# Patient Record
Sex: Female | Born: 1950 | Race: Black or African American | Hispanic: No | Smoking: Former smoker
Health system: Southern US, Community
[De-identification: ages and names within clinical notes are randomized; demographics above are authoritative.]

## PROBLEM LIST (undated history)

## (undated) DIAGNOSIS — K219 Gastro-esophageal reflux disease without esophagitis: Secondary | ICD-10-CM

## (undated) DIAGNOSIS — G709 Myoneural disorder, unspecified: Secondary | ICD-10-CM

## (undated) DIAGNOSIS — D649 Anemia, unspecified: Secondary | ICD-10-CM

## (undated) DIAGNOSIS — I10 Essential (primary) hypertension: Secondary | ICD-10-CM

## (undated) DIAGNOSIS — G4730 Sleep apnea, unspecified: Secondary | ICD-10-CM

## (undated) DIAGNOSIS — Z8719 Personal history of other diseases of the digestive system: Secondary | ICD-10-CM

## (undated) DIAGNOSIS — M1990 Unspecified osteoarthritis, unspecified site: Secondary | ICD-10-CM

## (undated) DIAGNOSIS — T7840XA Allergy, unspecified, initial encounter: Secondary | ICD-10-CM

## (undated) DIAGNOSIS — H269 Unspecified cataract: Secondary | ICD-10-CM

## (undated) DIAGNOSIS — R51 Headache: Secondary | ICD-10-CM

## (undated) HISTORY — DX: Allergy, unspecified, initial encounter: T78.40XA

## (undated) HISTORY — PX: TOTAL HIP ARTHROPLASTY: SHX124

## (undated) HISTORY — DX: Essential (primary) hypertension: I10

## (undated) HISTORY — PX: BACK SURGERY: SHX140

## (undated) HISTORY — DX: Unspecified cataract: H26.9

## (undated) HISTORY — PX: ABDOMINAL HYSTERECTOMY: SHX81

## (undated) HISTORY — DX: Myoneural disorder, unspecified: G70.9

## (undated) HISTORY — DX: Anemia, unspecified: D64.9

## (undated) HISTORY — PX: POLYPECTOMY: SHX149

## (undated) HISTORY — PX: OTHER SURGICAL HISTORY: SHX169

## (undated) HISTORY — PX: HERNIA REPAIR: SHX51

---

## 1997-10-16 ENCOUNTER — Ambulatory Visit (HOSPITAL_COMMUNITY): Admission: RE | Admit: 1997-10-16 | Discharge: 1997-10-16 | Payer: Self-pay | Admitting: *Deleted

## 1997-12-03 ENCOUNTER — Ambulatory Visit (HOSPITAL_COMMUNITY): Admission: RE | Admit: 1997-12-03 | Discharge: 1997-12-03 | Payer: Self-pay | Admitting: *Deleted

## 1998-01-30 ENCOUNTER — Emergency Department (HOSPITAL_COMMUNITY): Admission: EM | Admit: 1998-01-30 | Discharge: 1998-01-30 | Payer: Self-pay | Admitting: Emergency Medicine

## 1998-12-31 ENCOUNTER — Emergency Department (HOSPITAL_COMMUNITY): Admission: EM | Admit: 1998-12-31 | Discharge: 1998-12-31 | Payer: Self-pay

## 1999-03-14 ENCOUNTER — Encounter: Payer: Self-pay | Admitting: Emergency Medicine

## 1999-03-14 ENCOUNTER — Emergency Department (HOSPITAL_COMMUNITY): Admission: EM | Admit: 1999-03-14 | Discharge: 1999-03-14 | Payer: Self-pay | Admitting: Emergency Medicine

## 1999-03-30 ENCOUNTER — Ambulatory Visit (HOSPITAL_COMMUNITY): Admission: RE | Admit: 1999-03-30 | Discharge: 1999-03-30 | Payer: Self-pay | Admitting: *Deleted

## 1999-03-30 ENCOUNTER — Encounter: Payer: Self-pay | Admitting: *Deleted

## 1999-04-20 ENCOUNTER — Ambulatory Visit (HOSPITAL_COMMUNITY): Admission: RE | Admit: 1999-04-20 | Discharge: 1999-04-20 | Payer: Self-pay | Admitting: *Deleted

## 1999-04-20 ENCOUNTER — Encounter: Payer: Self-pay | Admitting: *Deleted

## 1999-06-14 ENCOUNTER — Encounter: Payer: Self-pay | Admitting: Neurosurgery

## 1999-06-15 ENCOUNTER — Inpatient Hospital Stay (HOSPITAL_COMMUNITY): Admission: RE | Admit: 1999-06-15 | Discharge: 1999-06-17 | Payer: Self-pay | Admitting: Neurosurgery

## 1999-06-15 ENCOUNTER — Encounter: Payer: Self-pay | Admitting: Neurosurgery

## 1999-06-15 ENCOUNTER — Encounter: Payer: Self-pay | Admitting: *Deleted

## 2000-05-08 ENCOUNTER — Encounter: Payer: Self-pay | Admitting: Family Medicine

## 2000-05-08 ENCOUNTER — Encounter: Admission: RE | Admit: 2000-05-08 | Discharge: 2000-05-08 | Payer: Self-pay | Admitting: Family Medicine

## 2000-06-04 ENCOUNTER — Ambulatory Visit (HOSPITAL_COMMUNITY): Admission: RE | Admit: 2000-06-04 | Discharge: 2000-06-04 | Payer: Self-pay | Admitting: Neurosurgery

## 2000-06-04 ENCOUNTER — Encounter: Payer: Self-pay | Admitting: Neurosurgery

## 2000-06-23 ENCOUNTER — Encounter: Admission: RE | Admit: 2000-06-23 | Discharge: 2000-08-04 | Payer: Self-pay | Admitting: Neurosurgery

## 2000-06-28 ENCOUNTER — Ambulatory Visit (HOSPITAL_COMMUNITY): Admission: RE | Admit: 2000-06-28 | Discharge: 2000-06-28 | Payer: Self-pay | Admitting: Neurosurgery

## 2000-06-28 ENCOUNTER — Encounter: Payer: Self-pay | Admitting: Neurosurgery

## 2000-07-12 ENCOUNTER — Encounter: Payer: Self-pay | Admitting: Neurosurgery

## 2000-07-12 ENCOUNTER — Ambulatory Visit (HOSPITAL_COMMUNITY): Admission: RE | Admit: 2000-07-12 | Discharge: 2000-07-12 | Payer: Self-pay | Admitting: Neurosurgery

## 2000-07-21 ENCOUNTER — Ambulatory Visit (HOSPITAL_COMMUNITY): Admission: RE | Admit: 2000-07-21 | Discharge: 2000-07-21 | Payer: Self-pay | Admitting: Neurosurgery

## 2000-07-21 ENCOUNTER — Encounter: Payer: Self-pay | Admitting: Neurosurgery

## 2000-08-01 ENCOUNTER — Encounter: Payer: Self-pay | Admitting: Neurosurgery

## 2000-08-01 ENCOUNTER — Inpatient Hospital Stay (HOSPITAL_COMMUNITY): Admission: RE | Admit: 2000-08-01 | Discharge: 2000-08-03 | Payer: Self-pay | Admitting: Neurosurgery

## 2000-09-04 ENCOUNTER — Encounter: Admission: RE | Admit: 2000-09-04 | Discharge: 2000-12-03 | Payer: Self-pay | Admitting: Neurosurgery

## 2000-10-17 ENCOUNTER — Encounter: Payer: Self-pay | Admitting: Neurosurgery

## 2000-10-17 ENCOUNTER — Ambulatory Visit (HOSPITAL_COMMUNITY): Admission: RE | Admit: 2000-10-17 | Discharge: 2000-10-17 | Payer: Self-pay | Admitting: Neurosurgery

## 2001-03-01 ENCOUNTER — Ambulatory Visit (HOSPITAL_COMMUNITY): Admission: RE | Admit: 2001-03-01 | Discharge: 2001-03-01 | Payer: Self-pay | Admitting: Neurosurgery

## 2001-03-01 ENCOUNTER — Encounter: Payer: Self-pay | Admitting: Neurosurgery

## 2001-11-30 ENCOUNTER — Encounter: Payer: Self-pay | Admitting: Neurosurgery

## 2001-12-04 ENCOUNTER — Encounter: Payer: Self-pay | Admitting: Neurosurgery

## 2001-12-04 ENCOUNTER — Inpatient Hospital Stay (HOSPITAL_COMMUNITY): Admission: RE | Admit: 2001-12-04 | Discharge: 2001-12-10 | Payer: Self-pay | Admitting: Neurosurgery

## 2002-03-23 ENCOUNTER — Emergency Department (HOSPITAL_COMMUNITY): Admission: EM | Admit: 2002-03-23 | Discharge: 2002-03-23 | Payer: Self-pay | Admitting: Emergency Medicine

## 2002-12-27 ENCOUNTER — Encounter: Payer: Self-pay | Admitting: Orthopedic Surgery

## 2003-01-01 ENCOUNTER — Encounter: Payer: Self-pay | Admitting: Orthopedic Surgery

## 2003-01-01 ENCOUNTER — Inpatient Hospital Stay (HOSPITAL_COMMUNITY): Admission: RE | Admit: 2003-01-01 | Discharge: 2003-01-05 | Payer: Self-pay | Admitting: Orthopedic Surgery

## 2003-04-04 ENCOUNTER — Inpatient Hospital Stay (HOSPITAL_COMMUNITY): Admission: RE | Admit: 2003-04-04 | Discharge: 2003-04-08 | Payer: Self-pay | Admitting: Orthopedic Surgery

## 2003-05-27 ENCOUNTER — Other Ambulatory Visit: Admission: RE | Admit: 2003-05-27 | Discharge: 2003-05-27 | Payer: Self-pay | Admitting: Nephrology

## 2003-05-27 ENCOUNTER — Other Ambulatory Visit: Admission: RE | Admit: 2003-05-27 | Discharge: 2003-05-27 | Payer: Self-pay | Admitting: Cardiology

## 2003-10-01 ENCOUNTER — Inpatient Hospital Stay (HOSPITAL_COMMUNITY): Admission: RE | Admit: 2003-10-01 | Discharge: 2003-10-03 | Payer: Self-pay | Admitting: Orthopedic Surgery

## 2003-10-29 ENCOUNTER — Encounter: Admission: RE | Admit: 2003-10-29 | Discharge: 2004-01-27 | Payer: Self-pay | Admitting: Orthopedic Surgery

## 2004-04-21 ENCOUNTER — Inpatient Hospital Stay (HOSPITAL_COMMUNITY): Admission: RE | Admit: 2004-04-21 | Discharge: 2004-04-23 | Payer: Self-pay | Admitting: Orthopedic Surgery

## 2004-05-18 ENCOUNTER — Encounter: Admission: RE | Admit: 2004-05-18 | Discharge: 2004-07-08 | Payer: Self-pay | Admitting: Orthopedic Surgery

## 2004-10-18 ENCOUNTER — Other Ambulatory Visit: Admission: RE | Admit: 2004-10-18 | Discharge: 2004-10-18 | Payer: Self-pay | Admitting: Nephrology

## 2004-12-08 ENCOUNTER — Ambulatory Visit: Payer: Self-pay | Admitting: Internal Medicine

## 2005-02-17 ENCOUNTER — Encounter: Admission: RE | Admit: 2005-02-17 | Discharge: 2005-02-17 | Payer: Self-pay | Admitting: Nephrology

## 2005-06-06 HISTORY — PX: COLONOSCOPY: SHX174

## 2005-06-19 ENCOUNTER — Ambulatory Visit (HOSPITAL_BASED_OUTPATIENT_CLINIC_OR_DEPARTMENT_OTHER): Admission: RE | Admit: 2005-06-19 | Discharge: 2005-06-19 | Payer: Self-pay | Admitting: Nephrology

## 2005-06-26 ENCOUNTER — Ambulatory Visit: Payer: Self-pay | Admitting: Internal Medicine

## 2005-07-13 ENCOUNTER — Ambulatory Visit: Payer: Self-pay | Admitting: Internal Medicine

## 2005-07-19 ENCOUNTER — Encounter (INDEPENDENT_AMBULATORY_CARE_PROVIDER_SITE_OTHER): Payer: Self-pay | Admitting: *Deleted

## 2005-07-19 ENCOUNTER — Ambulatory Visit: Payer: Self-pay | Admitting: Internal Medicine

## 2005-11-21 ENCOUNTER — Other Ambulatory Visit: Admission: RE | Admit: 2005-11-21 | Discharge: 2005-11-21 | Payer: Self-pay | Admitting: Nephrology

## 2006-05-17 ENCOUNTER — Encounter: Admission: RE | Admit: 2006-05-17 | Discharge: 2006-05-17 | Payer: Self-pay | Admitting: Nephrology

## 2006-06-05 ENCOUNTER — Encounter: Admission: RE | Admit: 2006-06-05 | Discharge: 2006-08-29 | Payer: Self-pay | Admitting: Nephrology

## 2006-12-28 ENCOUNTER — Other Ambulatory Visit: Admission: RE | Admit: 2006-12-28 | Discharge: 2006-12-28 | Payer: Self-pay | Admitting: Cardiology

## 2007-02-02 ENCOUNTER — Encounter: Admission: RE | Admit: 2007-02-02 | Discharge: 2007-02-02 | Payer: Self-pay | Admitting: Anesthesiology

## 2007-02-21 ENCOUNTER — Encounter: Admission: RE | Admit: 2007-02-21 | Discharge: 2007-02-21 | Payer: Self-pay | Admitting: General Surgery

## 2007-02-22 ENCOUNTER — Ambulatory Visit (HOSPITAL_BASED_OUTPATIENT_CLINIC_OR_DEPARTMENT_OTHER): Admission: RE | Admit: 2007-02-22 | Discharge: 2007-02-22 | Payer: Self-pay | Admitting: General Surgery

## 2008-04-01 ENCOUNTER — Ambulatory Visit (HOSPITAL_COMMUNITY): Admission: RE | Admit: 2008-04-01 | Discharge: 2008-04-01 | Payer: Self-pay | Admitting: Anesthesiology

## 2008-06-02 ENCOUNTER — Ambulatory Visit (HOSPITAL_COMMUNITY): Admission: RE | Admit: 2008-06-02 | Discharge: 2008-06-03 | Payer: Self-pay | Admitting: Neurological Surgery

## 2008-08-26 ENCOUNTER — Encounter: Admission: RE | Admit: 2008-08-26 | Discharge: 2008-08-26 | Payer: Self-pay | Admitting: Cardiology

## 2008-11-25 ENCOUNTER — Encounter: Admission: RE | Admit: 2008-11-25 | Discharge: 2008-11-25 | Payer: Self-pay | Admitting: Physician Assistant

## 2010-06-26 ENCOUNTER — Encounter: Payer: Self-pay | Admitting: Nephrology

## 2010-06-27 ENCOUNTER — Encounter (HOSPITAL_BASED_OUTPATIENT_CLINIC_OR_DEPARTMENT_OTHER): Payer: Self-pay | Admitting: General Surgery

## 2010-07-21 ENCOUNTER — Encounter: Payer: Self-pay | Admitting: Internal Medicine

## 2010-07-28 NOTE — Letter (Signed)
Summary: Colonoscopy Date Change Letter  [facility]  [address] [place], [place] [address]   Phone: [phone]  Fax: [phone]      July 21, 2010 MRN: [id number]   [facility] [address] [place], [place]  [address]   Dear Ms. Braziel,   Previously you were recommended to have a repeat colonoscopy around this time. Your chart was recently reviewed by Dr. Lina Sar of [facility]. Follow up colonoscopy is now recommended in February 2014. This revised recommendation is based on current, nationally recognized guidelines for colorectal cancer screening and polyp surveillance. These guidelines are endorsed by the American Cancer Society, [employer] on Colorectal Cancer as well as numerous other major medical organizations.  Please understand that our recommendation assumes that you do not have any new symptoms such as bleeding, a change in bowel habits, anemia, or significant abdominal discomfort. If you do have any concerning GI symptoms or want to discuss the guideline recommendations, please call to arrange an office visit at your earliest convenience. Otherwise we will keep you in our reminder system and contact you 1-2 months prior to the date listed above to schedule your next colonoscopy.  Thank you,  Hedwig Morton. Juanda Chance, M.D.  [facility] [phone]

## 2010-09-28 ENCOUNTER — Ambulatory Visit
Admission: RE | Admit: 2010-09-28 | Discharge: 2010-09-28 | Disposition: A | Discharge status: Home / Self Care | Payer: Medicare Other | Source: Ambulatory Visit | Attending: Nephrology | Admitting: Nephrology

## 2010-09-28 ENCOUNTER — Other Ambulatory Visit: Payer: Self-pay | Admitting: Nephrology

## 2010-09-28 DIAGNOSIS — R05 Cough: Secondary | ICD-10-CM

## 2010-10-19 NOTE — Op Note (Signed)
Madison Hines, Madison Hines              ACCOUNT NO.:  [id number]   MEDICAL RECORD NO.:  [id number]          PATIENT TYPE:  AMB   LOCATION:  NESC                         FACILITY:  [facility]   PHYSICIAN:  Leonie Man, M.D.   DATE OF BIRTH:  January 05, 1951   DATE OF PROCEDURE:  02/22/2007  DATE OF DISCHARGE:                               OPERATIVE REPORT   PREOPERATIVE DIAGNOSIS:  1. Incarcerated epigastric ventral hernia.  2. Umbilical hernia.   POSTOPERATIVE DIAGNOSES:  1. Incarcerated epigastric ventral hernia.  2. Umbilical hernia.   PROCEDURE:  Primary repair of epigastric hernia, and repair of umbilical  hernia with mesh.   SURGEON:  Leonie Man, M.D.   ASSISTANT:  OR nurse.   ANESTHESIA:  General.   NOTE:  The patient is a 60 year old female presenting originally with a  mass in the epigastrium approximately four fingerbreadths above the  umbilicus which has been quite symptomatic, and getting more so over the  past several years.  Additionally, on evaluation the patient has a  fairly large umbilical hernia.  She comes to the operating room, now,  after the risks and potential benefits of surgery have been fully  discussed, all questions answered, and consent obtained.   DESCRIPTION OF PROCEDURE:  The patient is positioned supinely and  following the induction of satisfactory general anesthesia, the abdomen  is prepped and draped to be included in a sterile operative field.  An  incision is made from the region of the epigastric hernia down through  the umbilicus and deepened through the skin and subcutaneous tissues  down to the midline fascia.   The epigastric hernia as well as the umbilical hernia were both  dissected free.  There was really firm and good tissue between these two  hernias, and I was rather unwilling to open them up into a single  defect.  Additionally, the opening for the epigastric hernia was really  small measuring approximately three-fourths of 1  cm.   I elected, then, to remove the incarcerated fat and portion of the  falciform ligament protruding through this hernia, and then dissected a  clear area around it, and closed this hernia primarily with interrupted  sutures of #0 Novofil.  At the umbilical hernia site all the adhesions  in this area were dissected free, and a medium ventral-X hernia patch  was placed within the defect, and sutured down to the fascial edges with  multiple sutures of #0 Novofil.  The patch was held such that it was  firm against the anterior abdominal wall.  The defect then was closed  over the patch with interrupted number #0 Novofil sutures.  All areas of  the dissection were then checked for hemostasis and noted to be dry.   Sponge and instrument counts were doubly verified.  The subcutaneous  tissues were then closed with interrupted suture of 2-0 Vicryl in the  subcutaneous tissue, and a running intradermal suture of 4-0 Monocryl,  then reinforced with Steri-Strips.  Sterile dressings were applied to  the wound.  The anesthetic reversed, and the patient removed from the  operating room  to the recovery room in stable condition.  He tolerated  the procedure well.      Leonie Man, M.D.  Electronically Signed     PB/MEDQ  D:  02/22/2007  T:  02/22/2007  Job:  [id number]

## 2010-10-19 NOTE — Op Note (Signed)
Madison Hines, Madison Hines              ACCOUNT NO.:  [id number]   MEDICAL RECORD NO.:  [id number]          PATIENT TYPE:  OIB   LOCATION:  3533                         FACILITY:  [facility]   PHYSICIAN:  Stefani Dama, M.D.  DATE OF BIRTH:  06-27-1950   DATE OF PROCEDURE:  06/02/2008  DATE OF DISCHARGE:                               OPERATIVE REPORT   PREOPERATIVE DIAGNOSIS:  Intractable back pain status post lumbar  fusion.   POSTOPERATIVE DIAGNOSIS:  Intractable back pain status post lumbar  fusion.   PROCEDURES:  Placement of permanent spinal cord stimulator Stage manager).   SURGEON:  Stefani Dama, MD   ANESTHESIA:  General endotracheal.   INDICATIONS:  Madison Hines is a 60 year old individual who has had  significant problems with back pain and lumbar radiculopathy.  She has  had a previous decompression and fusion of the lumbar spine and it was  not felt to be any other additional surgical intervention that may help  her.  A spinal cord stimulator is now being placed at the T9-T10 level  with the [employer] device.  She has had a trial by Dr. Thyra Breed who has referred the patient for implantation.   PROCEDURE:  The patient was brought to the operating room supine on the  stretcher.  After smooth induction of general endotracheal anesthesia,  she was turned prone.  The back was prepped with alcohol and DuraPrep  and draped in a sterile fashion.  The area of T9-T10 was localized  positively with fluoroscopy and then a vertical incision was created  over the interspace at T9-T10 and this was carried down to the  thoracodorsal fascia which was opened on the left side.  Interlaminar  space was explored on the T9-T10 area on the left side and a laminotomy  was created removing the superior margin of the lamina of T10 and the  associated fascia in this area.  Common dural tube was explored and then  several trials of dummy electrode passing device was placed  in the  epidural space to see if a full size paddle electrode would be placed.  Once this was passed easily, the paddle electrode was placed into the  epidural space and using fluoroscopic guidance, several adjustments were  performed to place the paddle electrode over the vertebral bodies of T8  and T9 centered on the dorsal aspect.  Once this was performed, then a  tie down was placed along one of the electrodes in the submuscular  region and then the electrodes were tunneled to a second incision made  in the region of the left flank which was enlarged with a subcutaneous  pocket for placement of the battery pack.  The tunneler instrument  allowed passage of the catheter sheath through which the two electrodes  were passed into the flank incision and then the electrodes were dried  carefully and connected to the battery.  Impedances were checked with  the battery placed subcutaneously and when these were noted to be  intact, the electrodes were torqued down to their final torque specs.  The battery  was placed in the subcutaneous pocket and then the  subcutaneous tissues were closed with 2-0 Vicryl in inverted and  interrupted fashion, 3-0 Vicryl was used  subcuticularly in both the median incision and the left flank incision.  Dermabond was placed on the skin.  Final radiographic confirmation of  the placement of electrode was obtained and secured for permanent  record.  The patient tolerated the procedure well.  Blood loss was  estimated less than 20 mL.      Stefani Dama, M.D.  Electronically Signed     HJE/MEDQ  D:  06/02/2008  T:  06/02/2008  Job:  [id number]

## 2010-10-22 NOTE — Op Note (Signed)
NAME:  Madison Hines, Madison Hines                        ACCOUNT NO.:  [id number]   MEDICAL RECORD NO.:  [id number]                   PATIENT TYPE:  INP   LOCATION:  0003                                 FACILITY:  [facility]   PHYSICIAN:  Ollen Gross, M.D.                 DATE OF BIRTH:  10/23/1950   DATE OF PROCEDURE:  10/01/2003  DATE OF DISCHARGE:                                 OPERATIVE REPORT   PREOPERATIVE DIAGNOSES:  Avascular necrosis, left shoulder.   POSTOPERATIVE DIAGNOSES:  Avascular necrosis, left shoulder.   PROCEDURE:  Left shoulder hemiarthroplasty.   SURGEON:  Ollen Gross, M.D.   ASSISTANT:  Alexzandrew L. Julien Girt, P.A.   ANESTHESIA:  Interscalene plus general.   ESTIMATED BLOOD LOSS:  250   DRAINS:  None.   COMPLICATIONS:  None.   CONDITION:  Stable to recovery.   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 She has got collapse of the  left humeral head with severe intractable pain. She presents now for left  shoulder hemiarthroplasty.   DESCRIPTION OF PROCEDURE:  After successful administration of interscalene  block and then general anesthetic, she is placed in the beach chair position  and left upper extremity is isolated from her shoulder girdle with plastic  drapes and prepped and draped in the usual sterile fashion.  An incision is  made for a deltopectoral approach, subcutaneous tissue dissected with  Metzenbaum scissors down to the level of the cephalic vein.  The  deltopectoral interval is identified and the vein and deltoid retracted  laterally while the pec is retracted medially.  I incised approximately 20%  of the pectoralis major insertion.  This lead to exposure of the  clavipectoral fascia which was incised to demonstrate the conjoined tendon.  We directed the conjoined tendon medially up at the  level of the coracoid so  as to avoid the musculocutaneous nerve.  The subscapularis was identified  and a vertical incision made.  The medial aspect is tagged with #1 Ethibond  sutures. The shoulder is then dislocated and the humeral head identified.  There is collapse throughout the central portion of the humeral head and she  does have some marginal osteophytes which are removed.  I placed the  resection template on the surface of the proximal humerus and marked out the  resection line.  Resection was made with an oscillating saw with the arm  externally rotated approximately 30 degrees to give [place] 30 degrees of  retroversion.  The resection is made and then the axial reaming is started  at a size 6 up to a size 8.  We tried to get a size 10 down the canal but it  would not go more than halfway down thus we had stopped at an 8.  Broaching  was at 6 and then at 8 we had excellent fill with excellent torsional  stability.  We then placed the size 8 global Depuy stem in 30 degrees of  retroversion and once again had excellent torsional stability.  I trialed a  44 x 15 humeral head based on what we had resected.  The head trial was  placed and then we reduced the shoulder and she had excellent stability with  about 50% inferior and 50% posterior translation.  The trial head is removed  and the permanent 44 x 15 global head is placed. The shoulder is again  reduced with the same stability.  The wound is copiously irrigated and the  subscap repaired with the interrupted Ethibond sutures.  The deltopectoral  interval is tacked closed with the #1 Vicryl, subcu is closed with  interrupted 2-0 Vicryl and subcuticular running 4-0 Monocryl. The incision  is cleaned and dried and Steri-Strips and a bulky sterile dressing applied.  She is then awakened and transported to recovery in stable condition.                                               Ollen Gross, M.D.    FA/MEDQ  D:  10/01/2003  T:   10/01/2003  Job:  [id number]

## 2010-10-22 NOTE — Discharge Summary (Signed)
[place]  Patient:    Madison Hines, Madison Hines Visit Number: [id number] MRN: [id number]          Service Type: SUR Location: 3000 [id number] [id number] Attending Physician:  Danella Penton Dictated by:   Tanya Nones. Jeral Fruit, M.D. 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  CLINICAL HISTORY:  The patient was admitted because of back pain with radiation down to both legs.  The patient has failed conservative treatment. The patient was seen by second opinion who agreed with the fusion.  Surgery was advised.  LABORATORY DATA:  Normal.  HOSPITAL COURSE:  The patient was taken to surgery and a bilateral L4-5 and L5-S1 diskectomy and fusion with pedicle screw and bone was achieved. The patient had quite a bit of difficulty standing immediately after surgery but now she is doing much better.  The wound likes fine.  She has no weakness. She wants to go home today.  CONDITION ON DISCHARGE:  Improving.  MEDICATIONS:  Flexeril, OxyCodone and OxyContin.  DIET:  Regular.  ACTIVITY:  She is not to drive.  She is not to do any lifting.  FOLLOW-UP:  To be see by me in my office in ten days. Dictated by:   Tanya Nones. Jeral Fruit, M.D. Attending Physician:  Danella Penton DD:  12/10/01 TD:  12/12/01 Job: [id number]

## 2010-10-22 NOTE — Op Note (Signed)
NAME:  Madison Hines, Madison Hines                        ACCOUNT NO.:  [id number]   MEDICAL RECORD NO.:  [id number]                   PATIENT TYPE:  INP   LOCATION:  0481                                 FACILITY:  [facility]   PHYSICIAN:  Ollen Gross, M.D.                 DATE OF BIRTH:  07-16-1950   DATE OF PROCEDURE:  04/04/2003  DATE OF DISCHARGE:                                 OPERATIVE REPORT   PREOPERATIVE DIAGNOSIS:  Avascular necrosis of the right hip.   POSTOPERATIVE DIAGNOSIS:  Avascular necrosis of the right hip.   PROCEDURE:  Right total hip arthroplasty.   SURGEON:  Gus Rankin. Aluisio, M.D.   ASSISTANT:  Alexzandrew L. Perkins, P.A.-C.   ANESTHESIA:  General.   ESTIMATED BLOOD LOSS:  400 mL.   DRAINS:  Hemovac x1.   COMPLICATIONS:  None.   CONDITION:  Stable to recovery room.   BRIEF CLINICAL NOTE:  Madison Hines is a 60 year old female who has high grade  avascular necrosis of the right hip with collapse of the femoral head.  She  has recently had a successful left total hip arthroplasty and presents now  for right total hip arthroplasty.   DESCRIPTION OF PROCEDURE:  After the successful administration of general  anesthetic, the patient was placed in the left lateral decubitus position  with the right side up and held with the hip positioner.  Right lower  extremity was isolated from her perineum with plastic drapes and prepped and  draped in the usual sterile fashion.  A short posterolateral incision was  utilized.  Skin cut with a 10 blade through the subcutaneous tissue to the  level of the fascia lata which was incised in line with the skin incision.  Sciatic nerve was palpated and protected and short rotators isolated off the  femur.  The capsulectomy was then performed.  The hip is dislocated and the  center of the femoral head marked.   Trial prosthesis is placed such that the center of the trial head  corresponds to the center of her native femoral head.   Osteotomy line is  marked on the femoral neck and osteotomy is made with an oscillating saw.  The head is removed and then the skin retracted anteriorly.  Acetabular  exposure obtained.   The acetabular labrum and any osteophytes were removed.  Reaming starts at  45 coursing in increments of 2-49 and a 50 mm Pinnacle acetabular shell was  placed in the anatomic position, transfixed with dome screws.  Trial 28 mm  neutral liner was placed.   The femur was then prepared first with a canal finder, then irrigation.  Axials ranged from 15.5 mm proximal reaming to a 20B, and the sleeve machine  to a small.  The 20B small trial sleeve, 25 x 13 stem, 36 standard neck is  placed.  I matched her native anteversion.  We put a 28 +0  head, reduced the  hip and there is great instability with full extension, flexion and  rotation, 70 degrees flexion, 40 degrees adduction, 90 degrees internal  rotation, 90 degrees flexion, 70 degrees internal rotation.  When I placed  the right leg on top of the left, the lengths are equal.   The hip is then dislocated and all trials were removed.  The permanent apex  eliminators were removed.  The permanent apex eliminator was placed into the  acetabular shell and a permanent 28 mm 4 neutral UltraMet metal liner was  placed.  This was a metal-on-metal construct.  Proximally in the femur  beneath the small 25 x 13 stem, 36 standard neck with a 28 +0 head.  The hip  was reduced and sustained stability parameters.   The wound was copiously irrigated with antibiotic solution and short  rotators reattached to the femur through drill holes. The fascia lata was  closed over a Hemovac drain with interrupted #1 Vicryl, subcu closed with #1  and 2-0 Vicryl, subcuticular running 4-0 Monocryl.  Incision was cleaned and  dried and Steri-Strips and a bulky sterile dressing applied.   She was subsequently awakened and transported to the recovery in stable  condition.                                                Ollen Gross, M.D.    FA/MEDQ  D:  04/04/2003  T:  04/04/2003  Job:  [id number]
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
## 2010-10-22 NOTE — H&P (Signed)
[place]. [facility]  Patient:    Madison Hines, Madison Hines Visit Number: [id number] MRN: [id number]          Service Type: SUR Location: 3000 [id number] Attending Physician:  Danella Penton Dictated by:   Tanya Nones. Jeral Fruit, M.D. Admit Date:  12/04/2001                           History and Physical  HISTORY OF PRESENT ILLNESS:  Madison Hines is a lady who underwent surgery back more than a year ago because of left L4-5 herniated disk and spondylosis at the level of 5-1.  The patient did well for awhile, but nevertheless she continued to have problems, having some discomfort, back pain, with radiation down to both legs, left one worse than the right one.  Review of the x-rays showed some degenerative changes at the level of 4-5 and 5-1.  We talked about surgery, and she insurance company sent her to Dr. Okey Dupre in [place] for a second opinion, who felt that because of the difficulty with this lady he advised fusion involving 4-5 and 5-1 with pedicle screws and posterolateral fusion.  This is the mean reason because she is being admitted today.  PAST MEDICAL HISTORY:  A 4-5 extraforaminal diskectomy, followed by 4-5 and 5-1 diskectomy.  ALLERGIES:  She is allergic to no medications.  SOCIAL HISTORY:  Does not drink, does smoke.  FAMILY HISTORY:  Her mother is in good health.  Father has carcinoma of the prostate and high blood pressure.  REVIEW OF SYSTEMS:  Depression _____ headache.  PHYSICAL EXAMINATION:  GENERAL:  The patient came to my office limping from the left leg.  HEENT:  Normal.  CHEST:  Clear.  CARDIAC:  Heart sounds normal.  EXTREMITIES:  There were normal extremities and normal pulses.  MUSCULOSKELETAL:  She has a midline scar in the lumbar spine.  Straight leg raise is positive bilateral at about 60 degrees.  She had difficulty walking on tiptoes and heels.  NEUROLOGIC:  Mental status normal.  Cranial nerves normal.   Strength: There is weakness on dorsiflexion of both feet, left one worse than the right one.  LABORATORY DATA:  The MRI shows severe degenerative changes at the level of 4-5 and 5-1.  CLINICAL IMPRESSION:  Chronic back pain secondary to degenerative disk disease at L4-5 and L5-1.  RECOMMENDATION:  The patient is being admitted for surgery.  The procedure will be L4-5 and L5-1 diskectomy with pedicle screws and posterolateral fusion.  The surgery was fully explained to her, including infection, CSF leak, worsening of the pain, paralysis, damage to the vessels of the abdomen, no improvement whatsoever, failure of the pedicle screws.  The patient is a Curator, and I talked to her about the Cell Saver system, which she fully agreed. Dictated by:   Tanya Nones. Jeral Fruit, M.D. Attending Physician:  Danella Penton DD:  12/04/01 TD:  12/05/01 Job: [id number]
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
## 2010-10-22 NOTE — Op Note (Signed)
[place]. [facility]  Patient:    Madison Hines, Madison Hines Visit Number: [id number] MRN: [id number]          Service Type: SUR Location: 3000 [id number] [id number] Attending Physician:  Danella Penton Dictated by:   Tanya Nones. Jeral Fruit, M.D. Proc. Date: 12/04/01 Admit Date:  12/04/2001                             Operative Report  PREOPERATIVE DIAGNOSIS:  Degenerative disk disease at L4-5 and L5-S1 with a polyradiculopathy and status post two lumbar diskectomies.  POSTOPERATIVE DIAGNOSIS:  Degenerative disk disease at L4-5 and L5-S1 with a polyradiculopathy and status post two lumbar diskectomies.  PROCEDURES: 1. Bilateral L4-5 and L5-S1 diskectomy, bone graft interbody fusion, pedicle    screws from L4 to L5 to S1 bilaterally, posterolateral fusion. 2. C-arm. 3. Cell Saver.  SURGEON:  Tanya Nones. Jeral Fruit, M.D.  ASSISTANTMena Goes. Franky Macho, M.D.  CLINICAL HISTORY;  Madison Hines is being admitted because of back pain with radiation to both legs, left one worse than the right one.  The patient had conservative treatment, and she was seen for a second opinion by another surgeon in [place].  Surgery was advised.  The risks were explained in the history and physical.  DESCRIPTION OF PROCEDURE:  The patient was taken to the OR, and she was positioned in a prone manner.  A midline incision resecting the previous wound was made.  Muscle and fascia were retracted laterally.  We identified the spinous processes of 4, 5, and 1.  Indeed, in the left side she had quite a bit of scar tissue up to the point that we were unable to see the anatomy. Because of that, we proceeded to remove the spinous processes of 4, 5, and 1. With the Leksell we did a bilateral laminectomy.  On the right side we decompressed the S1, L5, and L4 nerve root.  To the left side we did quite a bit of lysis of adhesions until we were able to visualize and fully decompress L4, L5, and S1.   Retraction was made, and we identified the L4-5 space.  Then total gross diskectomy from the left side and later on to the right side was done.  The same procedure was done at the level of 5-1.  Then we introduced a spacer.  Having done this, we brought he curette into L4-5 on one side and curetted the end plates.  The same procedure was done at the level of 5-1. Then we set up the area to introduce a 10 x 26 bone graft in the right side at the level of 4-5 and 12 x 26 at the level of 5-1 on the right side.  Then we came down to the left side.  We removed the spacer, and we introduced the same type of bone graft at those two levels.  Using Vitoss as well as the patients own bone, we filled up the disk space medially and laterally.  Then using the C-arm, we introduced the pedicle probe at the level of 4, 5, and 1.  This was followed by a tap and followed by screws.  All the screws were 40 x 6.5 except the one in the left side, which was 35 x 4.5.  AP and lateral showed good position of the bone with the screws as well as the bone graft.  From then on, this was followed by  a rod, which was put in place using the three screws bilaterally.  Then to prevent rotation, we used a link between the rod from the left onto the right side.  This was tightened in place.  Finally we went out into the lateral transverse space of 4-5 and the ala of the sacrum.  This was drilled.  Using the autograft as well as the Vitoss, the area was filled up with those two substances.  The patient had some arachnoid pouch, and we used Tisseel to prevent any CSF leak.  Having done this, the area was irrigated.  The Valsalva maneuver was negative.  Then after irrigation and hemostasis, the wound was closed with nylon and Steri-Strip.  The patient did well Dictated by:   Tanya Nones. Jeral Fruit, M.D. Attending Physician:  Danella Penton DD:  12/04/01 TD:  12/06/01 Job: [id number]

## 2010-10-22 NOTE — Discharge Summary (Signed)
NAME:  Madison Hines, Madison Hines                        ACCOUNT NO.:  [id number]   MEDICAL RECORD NO.:  [id number]                   PATIENT TYPE:  INP   LOCATION:  0481                                 FACILITY:  [facility]   PHYSICIAN:  Ollen Gross, M.D.                 DATE OF BIRTH:  [birth year]   DATE OF ADMISSION:  04/04/2003  DATE OF DISCHARGE:  04/08/2003                                 DISCHARGE SUMMARY   ADMITTING DIAGNOSES:  1. Right hip avascular necrosis.  2. History of depression.  3. Status post left upper bronchoplasty.   DISCHARGE DIAGNOSES:  1. Avascular necrosis right hip, status post right total hip replacement     arthroplasty.  2. Mild postoperative blood loss anemia.  3. History of depression.  4. Status post left upper bronchoplasty.   PROCEDURE:  Date of surgery:  April 04, 2003, right total hip  arthroplasty.  Surgeon:  Dr. Homero Fellers Aluisio.  Assistant:  Patrica Duel, P.A.-C.  Anesthesia:  General.  Blood loss 400 cc.  Hemovac drain  x1.   BRIEF HISTORY:  The patient is a 60 year old female with a high-grade  avascular necrosis of the right hip with collapse of the femoral head.  Previously had undergone a successful left total hip replacement  arthroplasty.  Now presents to have her right hip done.   LABORATORY DATA:  CBC on admission showed a hemoglobin of 12.9, hematocrit  of 38.5, last white cell count 8.9, red cell count 4.5, differential all  within normal limits.  Postoperative H&H 10.1 and 30.4.  Last noted H&H 9.7  and 29.5.  PT and PTT on admission 13 and 31 respectively with an INR of 1.  Serial protimes followed.  Last noted PT/INR 17.3 and 1.7.  Chemistry panel  on admission:  Elevated glucose of 149, total protein elevated 8.5, elevated  ALP of 118.  Serial BMETs were followed.  Electrolytes remained within  normal limits.  Urinalysis did show small leukocyte esterase and many  epithelial cells, 7-10 white cells, 0-2 red cells, few bacteria,  small  hemoglobin.  Blood group type O positive.   EKG dated December 27, 2002:  Normal sinus rhythm.  Nonspecific T-wave  abnormalities.  No significant change since last tracing.  Confirmed by Dr.  Jacinto Halim.   Pelvic films:  Degenerative changes right hip.  Post lumbar fusion.  Probable underlying avascular necrosis.  Postoperative films:  Satisfactory  appearance right total hip arthroplasty.   HOSPITAL COURSE:  The patient was admitted to [facility], taken to  the OR and underwent the above-stated procedure without complication.  The  patient tolerated the procedure well and was later transferred to the  recovery room and then taken to the orthopedic floor for continued  postoperative care.  Vital signs were followed.  The patient was placed on  PC analgesia for pain control following surgery.  Given 24 hours of  postoperative IV antibiotics.  Given Coumadin for deep vein thrombosis  prophylaxis.  Placed touchdown weightbearing.  PT and OT were consulted to  assist with gait training, ambulation, and ADLs.  Hemovac drain placed at  the time of surgery pulled on postoperative day #1.  By day #2 she was doing  better with her pain control.  Foley was discontinued.  They left the PCA in  one more day.  That was discontinued by day #3 when she was doing much  better.  She was progressing well with her physical therapy.  Wound was  healing well.  No signs of infection.  Seen by Dr. Lequita Halt on rounds on day  #4, doing well, pain better, and was discharged home.   DISCHARGE PLAN:  1. The patient was discharged home on April 08, 2003.  2. Discharge diagnoses:  Please see above.  3. Discharge medications:  Coumadin, Percocet, Robaxin.  4. Diet:  As tolerated.  5. Activity:  Touchdown weightbearing, hip precautions.  Home health PT and     home health nursing.  6. Follow-up:  In two weeks.   DISPOSITION:  Home.   CONDITION UPON DISCHARGE:  Improved.     Alexzandrew L.  [place] Girt, P.A.              Ollen Gross, M.D.    ALP/MEDQ  D:  05/09/2003  T:  05/09/2003  Job:  [id number]   cc:   Jarome Matin, M.D.  [address] [place]  [place] [address]  Fax: [phone]
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
## 2010-10-22 NOTE — Op Note (Signed)
NAMEKENNI, Madison Hines              ACCOUNT NO.:  [id number]   MEDICAL RECORD NO.:  [id number]          PATIENT TYPE:  INP   LOCATION:  0473                         FACILITY:  [facility]   PHYSICIAN:  Ollen Gross, M.D.    DATE OF BIRTH:  January 18, 1951   DATE OF PROCEDURE:  04/21/2004  DATE OF DISCHARGE:                                 OPERATIVE REPORT   PREOPERATIVE DIAGNOSIS:  Avascular necrosis, right shoulder.   POSTOPERATIVE DIAGNOSIS:  Avascular necrosis, right shoulder.   PROCEDURE:  Right shoulder hemiarthroplasty.   SURGEON:  Dr. Despina Hick.   ASSISTANT:  Avel Peace.   ASSISTANT:  Interscalene plus general.   ESTIMATED BLOOD LOSS:  100.   DRAINS:  None.   COMPLICATIONS:  None.   CONDITION:  Stable to recovery.   BRIEF CLINICAL NOTE:  Madison Hines is a 60 year old female who has had multiple  joint avascular necrosis and is undergoing replacements of both hips and her  left shoulder. 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 She presents now  for right shoulder hemiarthroplasty.   PROCEDURE IN DETAIL:  After successful administration of interscalene block  and general anesthetic, the patient is placed sitting on the operating table  supine in a semi-erect position. Her right upper extremity and shoulder  girdle were isolated from her trunk with plastic drapes and prepped and  draped in usual sterile fashion. Standard incision was made for the  deltopectoral approach. Incision was made with a 10 blade through the  subcutaneous tissue to the level of the deltopectoral interval. The cephalic  vein is identified, protected, and retracted laterally with the deltoid. We  then released the superior 1/3 of the pectoralis tendon off the humerus to  gain better exposure. Retractors were placed and the conjoined tendon  identified. The clavipectoral fascia was identified and incised. The  conjoined tendons were  retracted medially just below the tip of the coracoid  with attention being paid to not getting too far medial. A vertical incision  was then made in the subscapularis, and the medial edge is tagged with  Ethibond sutures. Shoulder was then dislocated.   The cutting guide was placed up against the humerus and a resection line  marked. The arm was then held in about 30 degrees of external rotation, and  the osteotomy made with an oscillating saw to give 30 degrees of  retroversion. We then reamed up to size 8 and broached to a size 8 which had  a great fit. The permanent size 8 global humeral stem was then placed in  about 30 degrees of retroversion. Had a very solid fit with excellent  torsional stability. We tried the 44 x 15 head and established the same size  of the head used on the other side as the same size as we templated this  head. The shoulder was then reduced with excellent stability about 50%  posterior, 50% inferior translation. We then placed the permanent 44 x 15  global head. The wound was then copiously irrigated with saline solution and  the subscapularis repaired with interrupted #1 Ethibond. The deltopectoral  intervals closed with #1 Vicryl, subcu closed interrupted 2-0 Vicryl,  subcuticular running 4-0 Monocryl. Incision was clean and dry. Steri-Strips  and a bulky sterile dressing applied. The arm was then placed through a  shoulder immobilizer, and she is then awakened and transported to recovery  in stable condition.     Drenda Freeze   FA/MEDQ  D:  04/21/2004  T:  04/22/2004  Job:  [id number]
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
## 2010-10-22 NOTE — Procedures (Signed)
Madison Hines, Madison Hines              ACCOUNT NO.:  [id number]   MEDICAL RECORD NO.:  [id number]          PATIENT TYPE:  OUT   LOCATION:  SLEEP CENTER                 FACILITY:  [facility]   PHYSICIAN:  Clinton D. Maple Hudson, M.D. DATE OF BIRTH:  01-20-1951   DATE OF STUDY:  06/19/2005                              NOCTURNAL POLYSOMNOGRAM   REFERRING PHYSICIAN:  Dr. Jeri Cos.   DATE OF STUDY:  June 19, 2005.   INDICATION FOR STUDY:  Hypersomnia with sleep apnea.   EPWORTH SLEEPINESS SCORE:  10/24.   BMI:  33.   WEIGHT:  202 pounds.   HOME MEDICATIONS:  Percocet, Lexapro, Lyrica, triamterene/HCTZ.   SLEEP ARCHITECTURE:  Total sleep time 360 minutes with sleep efficiency 82%.  Stage I was 10%, stage II 66%, stages III and IV 11%, REM 13% of total sleep  time. Sleep latency 11 minutes, REM latency 334 minutes, awake after sleep  onset 22 minutes, arousal index 15. Diazepam and Tizanidine were taken  before bedtime.   RESPIRATORY DATA:  Apnea/hypopnea index (AHI, RDI) 4 obstructive events per  hour which is within normal limits (0 to 5 per hour). Events were not  positional. REM AHI 16.4 per hour.   OXYGEN DATA:  Moderately loud snoring throughout the night with oxygen  desaturation to a nadir of 86%. Mean oxygen saturation through the study was  96% on room air.   CARDIAC DATA:  Normal sinus rhythm.   MOVEMENT/PARASOMNIA:  Leg jerks were noted with little impact on sleep.   IMPRESSION/RECOMMENDATIONS:  1.  Moderately loud snoring consistent with the patient's complaint.      Frequency of obstructive events was within normal adult limits, AHI 4      per hour. Events were not positional. There was mild oxygen desaturation      to 86%.  2.  Consider upper airway evaluation for congestion or obstruction which      might respond to therapy. Also consider      weight loss. C-PAP would not ordinarily be considered an appropriate      therapy for scores in this range, except in  special circumstances but      other interventions such as surgery for an oral appliance could be      tried.      Clinton D. Maple Hudson, M.D.  Diplomate, Biomedical engineer of Sleep Medicine  Electronically Signed     CDY/MEDQ  D:  06/26/2005 10:17:34  T:  06/27/2005 01:52:58  Job:  [id number]
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
## 2010-10-22 NOTE — H&P (Signed)
[place]. [facility]  Patient:    Madison Hines, Madison Hines                     MRN: [id number] Adm. Date:  [id number] Attending:  Danella Penton                         History and Physical  HISTORY:  Ms. Fake is a lady who in the past underwent an L4-5 diskectomy. The patient did really well, up to the point that she was happy with the result.  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  Since then, she is not any better.  I have seen this lady in different times, and she had conservative treatment, and she is not any better.  We repeated the x-ray, and indeed she has the possibility of a recurrent disk, not only at the level of L4-5, but also at the level of L5-S1.  She denies any problems whatsoever with the opposite leg.  The bladder and bowel are negative.  PAST MEDICAL HISTORY:  Positive for an L4-5 extraforaminal diskectomy.  ALLERGIES:  No known drug allergies.  SOCIAL HISTORY:  The patient does not drink.  She does not smoke.  FAMILY HISTORY:  Mother is in good health.  Father has carcinoma of the prostate and high blood pressure.  REVIEW OF SYSTEMS:  The patient has a history of depression, incoordination, and some headache.  PHYSICAL EXAMINATION:  GENERAL:  The patient came to my office limping with the left leg.  She had quite a bit of difficulty standing.  She had been getting strong pain medication without improvement.  HEENT:  Normal.  NECK:  Normal.  LUNGS:  Clear.  HEART:  Sounds normal.  ABDOMEN:  Normal.  EXTREMITIES:  Normal pulses.  NEUROLOGIC:  Mental status and cranial nerves normal.  Strength is 5/5, except for some weakness of dorsiflexion in the left foot.  Straight leg raising  on the left side is positive at 45 degrees.  The right side is about 80 degrees. She has some weakness of dorsiflexion of the left foot.  The femoral stretch maneuver is negative.  She has decrease in flexion of the lumbar spine.  The MRI showed that she has some scar tissue at the level of L4-5 and a broad-based ligament with stenosis and the possibility of a disk at the level of L5-S1 on the left side.  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  RECOMMENDATIONS:  The patient is being admitted, and the procedure will be a left L5 hemilaminectomy.  We are going to explore the L4 and L5 nerve roots. She knows about the risks such as infection, CSF leak, worsening of the pain, paralysis, or the need for further surgery. DD:  08/01/00 TD:  08/01/00 Job: [id number]/[id number]

## 2011-03-11 LAB — BASIC METABOLIC PANEL
CO2: 34 mEq/L — ABNORMAL HIGH (ref 19–32)
Calcium: 9.4 mg/dL (ref 8.4–10.5)
Chloride: 100 mEq/L (ref 96–112)
Creatinine, Ser: 0.77 mg/dL (ref 0.4–1.2)
GFR calc Af Amer: >60 mL/min (ref >60)
Glucose, Bld: 161 mg/dL — ABNORMAL HIGH (ref 70–99)
Sodium: 141 mEq/L (ref 135–145)

## 2011-03-11 LAB — CBC
Hemoglobin: 13.6 g/dL (ref 12.0–15.0)
MCHC: 32.5 g/dL (ref 30.0–36.0)
MCV: 92.7 fL (ref 78.0–100.0)
RBC: 4.51 MIL/uL (ref 3.87–5.11)
RDW: 12.5 % (ref 11.5–15.5)

## 2011-10-05 HISTORY — PX: EYE SURGERY: SHX253

## 2011-10-28 ENCOUNTER — Ambulatory Visit (INDEPENDENT_AMBULATORY_CARE_PROVIDER_SITE_OTHER): Payer: Medicare Other | Admitting: Obstetrics & Gynecology

## 2011-10-28 ENCOUNTER — Encounter: Payer: Self-pay | Admitting: Obstetrics & Gynecology

## 2011-10-28 VITALS — BP 156/87 | HR 75 | Temp 97.6°F | Ht 65.0 in | Wt 189.3 lb

## 2011-10-28 DIAGNOSIS — R51 Headache: Secondary | ICD-10-CM | POA: Diagnosis not present

## 2011-10-28 DIAGNOSIS — R5381 Other malaise: Secondary | ICD-10-CM

## 2011-10-28 DIAGNOSIS — Z01419 Encounter for gynecological examination (general) (routine) without abnormal findings: Secondary | ICD-10-CM | POA: Diagnosis not present

## 2011-10-28 DIAGNOSIS — R5383 Other fatigue: Secondary | ICD-10-CM

## 2011-10-28 LAB — CBC
MCV: 88.8 fL (ref 78.0–100.0)
Platelets: 220 10*3/uL (ref 150–400)
RBC: 4.3 MIL/uL (ref 3.87–5.11)
RDW: 12.1 % (ref 11.5–15.5)
WBC: 7.2 10*3/uL (ref 4.0–10.5)

## 2011-10-28 LAB — COMPREHENSIVE METABOLIC PANEL
Albumin: 4.4 g/dL (ref 3.5–5.2)
BUN: 19 mg/dL (ref 6–23)
CO2: 30 mEq/L (ref 19–32)
Calcium: 9.5 mg/dL (ref 8.4–10.5)
Glucose, Bld: 99 mg/dL (ref 70–99)
Potassium: 3.6 mEq/L (ref 3.5–5.3)
Sodium: 142 mEq/L (ref 135–145)
Total Protein: 7.5 g/dL (ref 6.0–8.3)

## 2011-10-28 LAB — TSH: TSH: 0.705 u[IU]/mL (ref 0.350–4.500)

## 2011-10-28 NOTE — Patient Instructions (Signed)
Preventive Care for Adults, Female A healthy lifestyle and preventive care can promote health and wellness. Preventive health guidelines for women include the following key practices.  A routine yearly physical is a good way to check with your caregiver about your health and preventive screening. It is a chance to share any concerns and updates on your health, and to receive a thorough exam.   Visit your dentist for a routine exam and preventive care every 6 months. Brush your teeth twice a day and floss once a day. Good oral hygiene prevents tooth decay and gum disease.   The frequency of eye exams is based on your age, health, family medical history, use of contact lenses, and other factors. Follow your caregiver's recommendations for frequency of eye exams.   Eat a healthy diet. Foods like vegetables, fruits, whole grains, low-fat dairy products, and lean protein foods contain the nutrients you need without too many calories. Decrease your intake of foods high in solid fats, added sugars, and salt. Eat the right amount of calories for you.Get information about a proper diet from your caregiver, if necessary.   Regular physical exercise is one of the most important things you can do for your health. Most adults should get at least 150 minutes of moderate-intensity exercise (any activity that increases your heart rate and causes you to sweat) each week. In addition, most adults need muscle-strengthening exercises on 2 or more days a week.   Maintain a healthy weight. The body mass index (BMI) is a screening tool to identify possible weight problems. It provides an estimate of body fat based on height and weight. Your caregiver can help determine your BMI, and can help you achieve or maintain a healthy weight.For adults 20 years and older:   A BMI below 18.5 is considered underweight.   A BMI of 18.5 to 24.9 is normal.   A BMI of 25 to 29.9 is considered overweight.   A BMI of 30 and above  is considered obese.   Maintain normal blood lipids and cholesterol levels by exercising and minimizing your intake of saturated fat. Eat a balanced diet with plenty of fruit and vegetables. Blood tests for lipids and cholesterol should begin at age 20 and be repeated every 5 years. If your lipid or cholesterol levels are high, you are over 50, or you are at high risk for heart disease, you may need your cholesterol levels checked more frequently.Ongoing high lipid and cholesterol levels should be treated with medicines if diet and exercise are not effective.   If you smoke, find out from your caregiver how to quit. If you do not use tobacco, do not start.   If you are pregnant, do not drink alcohol. If you are breastfeeding, be very cautious about drinking alcohol. If you are not pregnant and choose to drink alcohol, do not exceed 1 drink per day. One drink is considered to be 12 ounces (355 mL) of beer, 5 ounces (148 mL) of wine, or 1.5 ounces (44 mL) of liquor.   Avoid use of street drugs. Do not share needles with anyone. Ask for help if you need support or instructions about stopping the use of drugs.   High blood pressure causes heart disease and increases the risk of stroke. Your blood pressure should be checked at least every 1 to 2 years. Ongoing high blood pressure should be treated with medicines if weight loss and exercise are not effective.   If you are 55 to 63   years old, ask your caregiver if you should take aspirin to prevent strokes.   Diabetes screening involves taking a blood sample to check your fasting blood sugar level. This should be done once every 3 years, after age 45, if you are within normal weight and without risk factors for diabetes. Testing should be considered at a younger age or be carried out more frequently if you are overweight and have at least 1 risk factor for diabetes.   Breast cancer screening is essential preventive care for women. You should practice  "breast self-awareness." This means understanding the normal appearance and feel of your breasts and may include breast self-examination. Any changes detected, no matter how small, should be reported to a caregiver. Women in their 20s and 30s should have a clinical breast exam (CBE) by a caregiver as part of a regular health exam every 1 to 3 years. After age 40, women should have a CBE every year. Starting at age 40, women should consider having a mammography (breast X-ray test) every year. Women who have a family history of breast cancer should talk to their caregiver about genetic screening. Women at a high risk of breast cancer should talk to their caregivers about having magnetic resonance imaging (MRI) and a mammography every year.   The Pap test is a screening test for cervical cancer. A Pap test can show cell changes on the cervix that might become cervical cancer if left untreated. A Pap test is a procedure in which cells are obtained and examined from the lower end of the uterus (cervix).   Women should have a Pap test starting at age 21.   Between ages 21 and 29, Pap tests should be repeated every 2 years.   Beginning at age 30, you should have a Pap test every 3 years as long as the past 3 Pap tests have been normal.   Some women have medical problems that increase the chance of getting cervical cancer. Talk to your caregiver about these problems. It is especially important to talk to your caregiver if a new problem develops soon after your last Pap test. In these cases, your caregiver may recommend more frequent screening and Pap tests.   The above recommendations are the same for women who have or have not gotten the vaccine for human papillomavirus (HPV).   If you had a hysterectomy for a problem that was not cancer or a condition that could lead to cancer, then you no longer need Pap tests. Even if you no longer need a Pap test, a regular exam is a good idea to make sure no other  problems are starting.   If you are between ages 65 and 70, and you have had normal Pap tests going back 10 years, you no longer need Pap tests. Even if you no longer need a Pap test, a regular exam is a good idea to make sure no other problems are starting.   If you have had past treatment for cervical cancer or a condition that could lead to cancer, you need Pap tests and screening for cancer for at least 20 years after your treatment.   If Pap tests have been discontinued, risk factors (such as a new sexual partner) need to be reassessed to determine if screening should be resumed.   The HPV test is an additional test that may be used for cervical cancer screening. The HPV test looks for the virus that can cause the cell changes on the cervix.   The cells collected during the Pap test can be tested for HPV. The HPV test could be used to screen women aged 30 years and older, and should be used in women of any age who have unclear Pap test results. After the age of 30, women should have HPV testing at the same frequency as a Pap test.   Colorectal cancer can be detected and often prevented. Most routine colorectal cancer screening begins at the age of 50 and continues through age 75. However, your caregiver may recommend screening at an earlier age if you have risk factors for colon cancer. On a yearly basis, your caregiver may provide home test kits to check for hidden blood in the stool. Use of a small camera at the end of a tube, to directly examine the colon (sigmoidoscopy or colonoscopy), can detect the earliest forms of colorectal cancer. Talk to your caregiver about this at age 50, when routine screening begins. Direct examination of the colon should be repeated every 5 to 10 years through age 75, unless early forms of pre-cancerous polyps or small growths are found.   Hepatitis C blood testing is recommended for all people born from 1945 through 1965 and any individual with known risks for  hepatitis C.   Practice safe sex. Use condoms and avoid high-risk sexual practices to reduce the spread of sexually transmitted infections (STIs). STIs include gonorrhea, chlamydia, syphilis, trichomonas, herpes, HPV, and human immunodeficiency virus (HIV). Herpes, HIV, and HPV are viral illnesses that have no cure. They can result in disability, cancer, and death. Sexually active women aged 25 and younger should be checked for chlamydia. Older women with new or multiple partners should also be tested for chlamydia. Testing for other STIs is recommended if you are sexually active and at increased risk.   Osteoporosis is a disease in which the bones lose minerals and strength with aging. This can result in serious bone fractures. The risk of osteoporosis can be identified using a bone density scan. Women ages 65 and over and women at risk for fractures or osteoporosis should discuss screening with their caregivers. Ask your caregiver whether you should take a calcium supplement or vitamin D to reduce the rate of osteoporosis.   Menopause can be associated with physical symptoms and risks. Hormone replacement therapy is available to decrease symptoms and risks. You should talk to your caregiver about whether hormone replacement therapy is right for you.   Use sunscreen with sun protection factor (SPF) of 30 or more. Apply sunscreen liberally and repeatedly throughout the day. You should seek shade when your shadow is shorter than you. Protect yourself by wearing long sleeves, pants, a wide-brimmed hat, and sunglasses year round, whenever you are outdoors.   Once a month, do a whole body skin exam, using a mirror to look at the skin on your back. Notify your caregiver of new moles, moles that have irregular borders, moles that are larger than a pencil eraser, or moles that have changed in shape or color.   Stay current with required immunizations.   Influenza. You need a dose every fall (or winter). The  composition of the flu vaccine changes each year, so being vaccinated once is not enough.   Pneumococcal polysaccharide. You need 1 to 2 doses if you smoke cigarettes or if you have certain chronic medical conditions. You need 1 dose at age 65 (or older) if you have never been vaccinated.   Tetanus, diphtheria, pertussis (Tdap, Td). Get 1 dose of   Tdap vaccine if you are younger than age 65, are over 65 and have contact with an infant, are a healthcare worker, are pregnant, or simply want to be protected from whooping cough. After that, you need a Td booster dose every 10 years. Consult your caregiver if you have not had at least 3 tetanus and diphtheria-containing shots sometime in your life or have a deep or dirty wound.   HPV. You need this vaccine if you are a woman age 26 or younger. The vaccine is given in 3 doses over 6 months.   Measles, mumps, rubella (MMR). You need at least 1 dose of MMR if you were born in 1957 or later. You may also need a second dose.   Meningococcal. If you are age 19 to 21 and a first-year college student living in a residence hall, or have one of several medical conditions, you need to get vaccinated against meningococcal disease. You may also need additional booster doses.   Zoster (shingles). If you are age 60 or older, you should get this vaccine.   Varicella (chickenpox). If you have never had chickenpox or you were vaccinated but received only 1 dose, talk to your caregiver to find out if you need this vaccine.   Hepatitis A. You need this vaccine if you have a specific risk factor for hepatitis A virus infection or you simply wish to be protected from this disease. The vaccine is usually given as 2 doses, 6 to 18 months apart.   Hepatitis B. You need this vaccine if you have a specific risk factor for hepatitis B virus infection or you simply wish to be protected from this disease. The vaccine is given in 3 doses, usually over 6 months.  Preventive Services /  Frequency Ages 65 and over  Blood pressure check.** / Every 1 to 2 years.   Lipid and cholesterol check.** / Every 5 years beginning at age 20.   Clinical breast exam.** / Every year after age 40.   Mammogram.** / Every year beginning at age 40 and continuing for as long as you are in good health. Consult with your caregiver.   Pap test.** / Every 3 years starting at age 30 through age 65 or 70 with a 3 consecutive normal Pap tests. Testing can be stopped between 65 and 70 with 3 consecutive normal Pap tests and no abnormal Pap or HPV tests in the past 10 years.   HPV screening.** / Every 3 years from ages 30 through ages 65 or 70 with a history of 3 consecutive normal Pap tests. Testing can be stopped between 65 and 70 with 3 consecutive normal Pap tests and no abnormal Pap or HPV tests in the past 10 years.   Fecal occult blood test (FOBT) of stool. / Every year beginning at age 50 and continuing until age 75. You may not need to do this test if you get a colonoscopy every 10 years.   Flexible sigmoidoscopy or colonoscopy.** / Every 5 years for a flexible sigmoidoscopy or every 10 years for a colonoscopy beginning at age 50 and continuing until age 75.   Hepatitis C blood test.** / For all people born from 1945 through 1965 and any individual with known risks for hepatitis C.   Osteoporosis screening.** / A one-time screening for women ages 65 and over and women at risk for fractures or osteoporosis.   Skin self-exam. / Monthly.   Influenza immunization.** / Every year.   Pneumococcal polysaccharide immunization.** /   1 dose at age 65 (or older) if you have never been vaccinated.   Tetanus, diphtheria, pertussis (Tdap, Td) immunization. / A one-time dose of Tdap vaccine if you are over 65 and have contact with an infant, are a healthcare worker, or simply want to be protected from whooping cough. After that, you need a Td booster dose every 10 years.   Varicella immunization.** /  Consult your caregiver.   Meningococcal immunization.** / Consult your caregiver.   Hepatitis A immunization.** / Consult your caregiver. 2 doses, 6 to 18 months apart.   Hepatitis B immunization.** / Check with your caregiver. 3 doses, usually over 6 months.  ** Family history and personal history of risk and conditions may change your caregiver's recommendations. Document Released: 07/19/2001 Document Revised: 05/12/2011 Document Reviewed: 10/18/2010 [employer] Patient Information 2012 [employer]. 
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
## 2011-11-11 ENCOUNTER — Ambulatory Visit
Admission: RE | Admit: 2011-11-11 | Discharge: 2011-11-11 | Disposition: A | Discharge status: Home / Self Care | Payer: Medicare Other | Source: Ambulatory Visit | Attending: Anesthesiology | Admitting: Anesthesiology

## 2011-11-11 ENCOUNTER — Other Ambulatory Visit: Payer: Self-pay | Admitting: Anesthesiology

## 2011-11-11 DIAGNOSIS — R51 Headache: Secondary | ICD-10-CM

## 2012-01-28 ENCOUNTER — Encounter (HOSPITAL_COMMUNITY): Payer: Self-pay | Admitting: Radiology

## 2012-01-28 ENCOUNTER — Emergency Department (HOSPITAL_COMMUNITY)
Admission: EM | Admit: 2012-01-28 | Discharge: 2012-01-28 | Disposition: A | Discharge status: Home / Self Care | Payer: Medicare Other | Attending: Emergency Medicine | Admitting: Emergency Medicine

## 2012-01-28 ENCOUNTER — Emergency Department (HOSPITAL_COMMUNITY): Payer: Medicare Other

## 2012-01-28 DIAGNOSIS — M542 Cervicalgia: Secondary | ICD-10-CM | POA: Insufficient documentation

## 2012-01-28 DIAGNOSIS — Y92410 Unspecified street and highway as the place of occurrence of the external cause: Secondary | ICD-10-CM | POA: Insufficient documentation

## 2012-01-28 DIAGNOSIS — I10 Essential (primary) hypertension: Secondary | ICD-10-CM | POA: Insufficient documentation

## 2012-01-28 DIAGNOSIS — R51 Headache: Secondary | ICD-10-CM | POA: Insufficient documentation

## 2012-01-28 DIAGNOSIS — M545 Low back pain, unspecified: Secondary | ICD-10-CM | POA: Insufficient documentation

## 2012-01-28 MED ORDER — HYDROMORPHONE HCL PF 1 MG/ML IJ SOLN
1.0000 mg | INTRAMUSCULAR | Status: AC
  Administered 2012-01-28: 1 mg via INTRAVENOUS
  Filled 2012-01-28: qty 1

## 2012-01-28 MED ORDER — ONDANSETRON HCL 4 MG/2ML IJ SOLN
4.0000 mg | Freq: Once | INTRAMUSCULAR | Status: AC
  Administered 2012-01-28: 4 mg via INTRAVENOUS
  Filled 2012-01-28: qty 2

## 2012-01-28 MED ORDER — HYDROMORPHONE HCL PF 1 MG/ML IJ SOLN
0.5000 mg | INTRAMUSCULAR | Status: AC
  Administered 2012-01-28: 0.5 mg via INTRAVENOUS
  Filled 2012-01-28: qty 1

## 2012-01-28 NOTE — ED Notes (Signed)
Pt states understanding of discharge instructions 
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
## 2012-04-18 ENCOUNTER — Other Ambulatory Visit: Payer: Self-pay | Admitting: Neurosurgery

## 2012-04-18 DIAGNOSIS — M5410 Radiculopathy, site unspecified: Secondary | ICD-10-CM

## 2012-04-18 DIAGNOSIS — M542 Cervicalgia: Secondary | ICD-10-CM

## 2012-04-18 DIAGNOSIS — M549 Dorsalgia, unspecified: Secondary | ICD-10-CM

## 2012-04-24 ENCOUNTER — Ambulatory Visit
Admission: RE | Admit: 2012-04-24 | Discharge: 2012-04-24 | Disposition: A | Discharge status: Home / Self Care | Payer: Medicare Other | Source: Ambulatory Visit | Attending: Neurosurgery | Admitting: Neurosurgery

## 2012-04-24 VITALS — BP 106/55 | HR 58

## 2012-04-24 DIAGNOSIS — M5410 Radiculopathy, site unspecified: Secondary | ICD-10-CM

## 2012-04-24 DIAGNOSIS — M549 Dorsalgia, unspecified: Secondary | ICD-10-CM

## 2012-04-24 DIAGNOSIS — M542 Cervicalgia: Secondary | ICD-10-CM

## 2012-04-24 MED ORDER — IOHEXOL 180 MG/ML  SOLN
17.0000 mL | Freq: Once | INTRAMUSCULAR | Status: AC | PRN
  Administered 2012-04-24: 17 mL via INTRATHECAL

## 2012-04-24 MED ORDER — DIAZEPAM 5 MG PO TABS
10.0000 mg | ORAL_TABLET | Freq: Once | ORAL | Status: AC
  Administered 2012-04-24: 10 mg via ORAL

## 2012-04-24 NOTE — Progress Notes (Signed)
Pt has been off lexapro since last week.  dd

## 2012-05-31 ENCOUNTER — Other Ambulatory Visit: Payer: Self-pay | Admitting: Neurosurgery

## 2012-06-18 ENCOUNTER — Encounter (HOSPITAL_COMMUNITY): Payer: Self-pay

## 2012-06-20 ENCOUNTER — Encounter (HOSPITAL_COMMUNITY)
Admission: RE | Admit: 2012-06-20 | Discharge: 2012-06-20 | Disposition: A | Discharge status: Home / Self Care | Payer: Medicare Other | Source: Ambulatory Visit | Attending: Neurosurgery | Admitting: Neurosurgery

## 2012-06-20 ENCOUNTER — Encounter (HOSPITAL_COMMUNITY): Payer: Self-pay

## 2012-06-20 ENCOUNTER — Encounter (HOSPITAL_COMMUNITY)
Admission: RE | Admit: 2012-06-20 | Discharge: 2012-06-20 | Disposition: A | Discharge status: Home / Self Care | Payer: Medicare Other | Source: Ambulatory Visit | Attending: Anesthesiology | Admitting: Anesthesiology

## 2012-06-20 HISTORY — DX: Personal history of other diseases of the digestive system: Z87.19

## 2012-06-20 HISTORY — DX: Sleep apnea, unspecified: G47.30

## 2012-06-20 HISTORY — DX: Gastro-esophageal reflux disease without esophagitis: K21.9

## 2012-06-20 HISTORY — DX: Unspecified osteoarthritis, unspecified site: M19.90

## 2012-06-20 HISTORY — DX: Headache: R51

## 2012-06-20 LAB — BASIC METABOLIC PANEL
CO2: 31 mEq/L (ref 19–32)
Calcium: 9.7 mg/dL (ref 8.4–10.5)
Creatinine, Ser: 0.82 mg/dL (ref 0.50–1.10)
GFR calc non Af Amer: 75 mL/min — ABNORMAL LOW (ref >90)
Glucose, Bld: 96 mg/dL (ref 70–99)
Sodium: 139 mEq/L (ref 135–145)

## 2012-06-20 LAB — CBC
MCH: 31.6 pg (ref 26.0–34.0)
MCV: 91.9 fL (ref 78.0–100.0)
Platelets: 196 10*3/uL (ref 150–400)
RDW: 12.5 % (ref 11.5–15.5)
WBC: 8.6 10*3/uL (ref 4.0–10.5)

## 2012-06-20 LAB — SURGICAL PCR SCREEN: MRSA, PCR: NEGATIVE

## 2012-06-20 NOTE — Pre-Procedure Instructions (Signed)
Madison Hines  06/20/2012   Your procedure is scheduled on:  Friday June 29, 2012  Report to [facility] at 7:00 AM.  Call this number if you have problems the morning of surgery: [phone]   Remember:   Do not eat food or drink liquids after midnight.   Take these medicines the morning of surgery with A SIP OF WATER: amlodipine, lexapro, oxycodone,    Do not wear jewelry, make-up or nail polish.  Do not wear lotions, powders, or perfumes.  Do not shave 48 hours prior to surgery.  Do not bring valuables to the hospital.  Contacts, dentures or bridgework may not be worn into surgery.  Leave suitcase in the car. After surgery it may be brought to your room.  For patients admitted to the hospital, checkout time is 11:00 AM the day of  discharge.   Patients discharged the day of surgery will not be allowed to drive  home.  Name and phone number of your driver: family / friend  Special Instructions: Shower using CHG 2 nights before surgery and the night before surgery.  If you shower the day of surgery use CHG.  Use special wash - you have one bottle of CHG for all showers.  You should use approximately 1/3 of the bottle for each shower.   Please read over the following fact sheets that you were given: Pain Booklet, Coughing and Deep Breathing, Blood Transfusion Information, MRSA Information and Surgical Site Infection Prevention

## 2012-06-21 NOTE — Consult Note (Signed)
Anesthesia Chart Review:  Patient is a 62 year old female scheduled for L3-4 diskectomy, L3-4, L4-S1 fusion by Dr. Jeral Fruit on 06/29/12.  History includes former smoker, HTN, obesity, GERD, anxiety, OSA, headaches, arthritis, hysterectomy, prior back, hip, eye, hernia, and shoulder surgeries.  PCP is listed as Dr. Renaye Rakers.  EKG on 06/20/12 showed NSR, non-specific T wave abnormality.  (No history of pacemaker.)  CXR report on 06/20/12 showed: Cardiomediastinal silhouette is stable. No acute infiltrate or pleural effusion. No pulmonary edema. Again noted neurostimulator in mid thoracic spine. Partially visualized bilateral humeral prosthesis.   Pre-operative labs noted.  H/H 12.8/37.2.  She has refused all blood products.    Anticipate she can proceed as planned.  Shonna Chock, PA-C 06/21/12 1428

## 2012-06-27 ENCOUNTER — Encounter: Payer: Self-pay | Admitting: *Deleted

## 2012-06-28 MED ORDER — CEFAZOLIN SODIUM-DEXTROSE 2-3 GM-% IV SOLR
2.0000 g | INTRAVENOUS | Status: AC
  Administered 2012-06-29: 2 g via INTRAVENOUS

## 2012-06-29 ENCOUNTER — Encounter (HOSPITAL_COMMUNITY): Payer: Self-pay | Admitting: *Deleted

## 2012-06-29 ENCOUNTER — Encounter (HOSPITAL_COMMUNITY)
Admission: RE | Disposition: A | Discharge status: Home / Self Care | Payer: Self-pay | Source: Ambulatory Visit | Attending: Neurosurgery

## 2012-06-29 ENCOUNTER — Inpatient Hospital Stay (HOSPITAL_COMMUNITY): Payer: Medicare Other

## 2012-06-29 ENCOUNTER — Encounter (HOSPITAL_COMMUNITY): Payer: Self-pay | Admitting: Vascular Surgery

## 2012-06-29 ENCOUNTER — Inpatient Hospital Stay (HOSPITAL_COMMUNITY)
Admission: RE | Admit: 2012-06-29 | Discharge: 2012-07-06 | DRG: 460 | Disposition: A | Discharge status: Home / Self Care | Payer: Medicare Other | Source: Ambulatory Visit | Attending: Neurosurgery | Admitting: Neurosurgery

## 2012-06-29 ENCOUNTER — Inpatient Hospital Stay (HOSPITAL_COMMUNITY): Payer: Medicare Other | Admitting: Vascular Surgery

## 2012-06-29 DIAGNOSIS — K449 Diaphragmatic hernia without obstruction or gangrene: Secondary | ICD-10-CM | POA: Diagnosis present

## 2012-06-29 DIAGNOSIS — K219 Gastro-esophageal reflux disease without esophagitis: Secondary | ICD-10-CM | POA: Diagnosis present

## 2012-06-29 DIAGNOSIS — M51379 Other intervertebral disc degeneration, lumbosacral region without mention of lumbar back pain or lower extremity pain: Principal | ICD-10-CM | POA: Diagnosis present

## 2012-06-29 DIAGNOSIS — I10 Essential (primary) hypertension: Secondary | ICD-10-CM | POA: Diagnosis present

## 2012-06-29 DIAGNOSIS — E669 Obesity, unspecified: Secondary | ICD-10-CM | POA: Diagnosis present

## 2012-06-29 DIAGNOSIS — Z87891 Personal history of nicotine dependence: Secondary | ICD-10-CM

## 2012-06-29 DIAGNOSIS — M5137 Other intervertebral disc degeneration, lumbosacral region: Principal | ICD-10-CM | POA: Diagnosis present

## 2012-06-29 DIAGNOSIS — Z981 Arthrodesis status: Secondary | ICD-10-CM

## 2012-06-29 DIAGNOSIS — Z96649 Presence of unspecified artificial hip joint: Secondary | ICD-10-CM

## 2012-06-29 DIAGNOSIS — Z96619 Presence of unspecified artificial shoulder joint: Secondary | ICD-10-CM

## 2012-06-29 SURGERY — POSTERIOR LUMBAR FUSION 1 LEVEL
Anesthesia: General | Site: Spine Lumbar | Wound class: Clean

## 2012-06-29 MED ORDER — BENAZEPRIL HCL 10 MG PO TABS
10.0000 mg | ORAL_TABLET | Freq: Every day | ORAL | Status: DC
  Administered 2012-07-01 – 2012-07-06 (×6): 10 mg via ORAL
  Filled 2012-06-29 (×8): qty 1

## 2012-06-29 MED ORDER — HYDROCHLOROTHIAZIDE 12.5 MG PO CAPS
12.5000 mg | ORAL_CAPSULE | Freq: Every day | ORAL | Status: DC
  Filled 2012-06-29: qty 1

## 2012-06-29 MED ORDER — TRIAMTERENE-HCTZ 37.5-25 MG PO CAPS
1.0000 | ORAL_CAPSULE | Freq: Every day | ORAL | Status: DC
Start: 2012-06-29 — End: 2012-06-29

## 2012-06-29 MED ORDER — THROMBIN 20000 UNITS EX SOLR
CUTANEOUS | Status: DC | PRN
  Administered 2012-06-29: 12:00:00 via TOPICAL

## 2012-06-29 MED ORDER — SODIUM CHLORIDE 0.9 % IJ SOLN: 9.0000 mL | INTRAMUSCULAR | Status: DC | PRN

## 2012-06-29 MED ORDER — 0.9 % SODIUM CHLORIDE (POUR BTL) OPTIME
TOPICAL | Status: DC | PRN
  Administered 2012-06-29: 1000 mL

## 2012-06-29 MED ORDER — EPHEDRINE SULFATE 50 MG/ML IJ SOLN
INTRAMUSCULAR | Status: DC | PRN
  Administered 2012-06-29: 5 mg via INTRAVENOUS
  Administered 2012-06-29: 10 mg via INTRAVENOUS
  Administered 2012-06-29 (×2): 5 mg via INTRAVENOUS

## 2012-06-29 MED ORDER — DIAZEPAM 5 MG PO TABS
ORAL_TABLET | ORAL | Status: AC
  Filled 2012-06-29: qty 1

## 2012-06-29 MED ORDER — CEFAZOLIN SODIUM 1-5 GM-% IV SOLN
1.0000 g | Freq: Three times a day (TID) | INTRAVENOUS | Status: AC
  Administered 2012-06-29 – 2012-06-30 (×2): 1 g via INTRAVENOUS
  Filled 2012-06-29 (×2): qty 50

## 2012-06-29 MED ORDER — ACETAMINOPHEN 325 MG PO TABS
650.0000 mg | ORAL_TABLET | ORAL | Status: DC | PRN
  Administered 2012-07-02 – 2012-07-03 (×2): 650 mg via ORAL
  Filled 2012-06-29 (×2): qty 2

## 2012-06-29 MED ORDER — FENTANYL CITRATE 0.05 MG/ML IJ SOLN: 50.0000 ug | Freq: Once | INTRAMUSCULAR | Status: DC

## 2012-06-29 MED ORDER — PROMETHAZINE HCL 25 MG/ML IJ SOLN: 6.2500 mg | INTRAMUSCULAR | Status: DC | PRN

## 2012-06-29 MED ORDER — DIPHENHYDRAMINE HCL 12.5 MG/5ML PO ELIX: 12.5000 mg | ORAL_SOLUTION | Freq: Four times a day (QID) | ORAL | Status: DC | PRN

## 2012-06-29 MED ORDER — LACTATED RINGERS IV SOLN
INTRAVENOUS | Status: DC | PRN
  Administered 2012-06-29 (×2): via INTRAVENOUS

## 2012-06-29 MED ORDER — MORPHINE SULFATE (PF) 1 MG/ML IV SOLN
INTRAVENOUS | Status: DC
  Administered 2012-06-29: 21:00:00 via INTRAVENOUS
  Administered 2012-06-29: 12.5 mg via INTRAVENOUS
  Administered 2012-06-29: 6 mg via INTRAVENOUS
  Administered 2012-06-30: 17.85 mg via INTRAVENOUS
  Administered 2012-06-30 (×2): via INTRAVENOUS
  Administered 2012-06-30: 18.95 mg via INTRAVENOUS
  Administered 2012-06-30: 7.5 mg via INTRAVENOUS
  Administered 2012-07-01: 1.5 mg via INTRAVENOUS
  Administered 2012-07-01: 4.5 mg via INTRAVENOUS
  Filled 2012-06-29 (×3): qty 25

## 2012-06-29 MED ORDER — VECURONIUM BROMIDE 10 MG IV SOLR
INTRAVENOUS | Status: DC | PRN
  Administered 2012-06-29: 1 mg via INTRAVENOUS
  Administered 2012-06-29 (×2): 2 mg via INTRAVENOUS

## 2012-06-29 MED ORDER — BUPIVACAINE LIPOSOME 1.3 % IJ SUSP
INTRAMUSCULAR | Status: DC | PRN
  Administered 2012-06-29: 20 mL

## 2012-06-29 MED ORDER — ONDANSETRON HCL 4 MG/2ML IJ SOLN: 4.0000 mg | Freq: Four times a day (QID) | INTRAMUSCULAR | Status: DC | PRN

## 2012-06-29 MED ORDER — ZOLPIDEM TARTRATE 5 MG PO TABS: 5.0000 mg | ORAL_TABLET | Freq: Every evening | ORAL | Status: DC | PRN

## 2012-06-29 MED ORDER — HYDROMORPHONE HCL PF 1 MG/ML IJ SOLN
INTRAMUSCULAR | Status: AC
  Administered 2012-06-29: 0.5 mg
  Filled 2012-06-29: qty 1

## 2012-06-29 MED ORDER — SODIUM CHLORIDE 0.9 % IV SOLN
INTRAVENOUS | Status: DC
  Administered 2012-06-29 – 2012-07-01 (×3): via INTRAVENOUS

## 2012-06-29 MED ORDER — BENAZEPRIL-HYDROCHLOROTHIAZIDE 10-12.5 MG PO TABS: 1.0000 | ORAL_TABLET | Freq: Every day | ORAL | Status: DC

## 2012-06-29 MED ORDER — SODIUM CHLORIDE 0.9 % IJ SOLN
3.0000 mL | Freq: Two times a day (BID) | INTRAMUSCULAR | Status: DC
  Administered 2012-07-01 (×2): 3 mL via INTRAVENOUS

## 2012-06-29 MED ORDER — ROCURONIUM BROMIDE 100 MG/10ML IV SOLN
INTRAVENOUS | Status: DC | PRN
  Administered 2012-06-29: 50 mg via INTRAVENOUS

## 2012-06-29 MED ORDER — AMLODIPINE BESYLATE 5 MG PO TABS
5.0000 mg | ORAL_TABLET | Freq: Every day | ORAL | Status: DC
  Administered 2012-07-01 – 2012-07-06 (×6): 5 mg via ORAL
  Filled 2012-06-29 (×8): qty 1

## 2012-06-29 MED ORDER — MIDAZOLAM HCL 5 MG/5ML IJ SOLN
INTRAMUSCULAR | Status: DC | PRN
  Administered 2012-06-29: 2 mg via INTRAVENOUS

## 2012-06-29 MED ORDER — DIPHENHYDRAMINE HCL 50 MG/ML IJ SOLN: 12.5000 mg | Freq: Four times a day (QID) | INTRAMUSCULAR | Status: DC | PRN

## 2012-06-29 MED ORDER — ACETAMINOPHEN 650 MG RE SUPP: 650.0000 mg | RECTAL | Status: DC | PRN

## 2012-06-29 MED ORDER — ARTIFICIAL TEARS OP OINT
TOPICAL_OINTMENT | OPHTHALMIC | Status: DC | PRN
  Administered 2012-06-29: 1 via OPHTHALMIC

## 2012-06-29 MED ORDER — MIDAZOLAM HCL 2 MG/2ML IJ SOLN: 1.0000 mg | INTRAMUSCULAR | Status: DC | PRN

## 2012-06-29 MED ORDER — SODIUM CHLORIDE 0.9 % IV SOLN
INTRAVENOUS | Status: DC | PRN
  Administered 2012-06-29: 10:00:00 via INTRAVENOUS

## 2012-06-29 MED ORDER — SODIUM CHLORIDE 0.9 % IJ SOLN: 3.0000 mL | INTRAMUSCULAR | Status: DC | PRN

## 2012-06-29 MED ORDER — NEOSTIGMINE METHYLSULFATE 1 MG/ML IJ SOLN
INTRAMUSCULAR | Status: DC | PRN
  Administered 2012-06-29: 3 mg via INTRAVENOUS

## 2012-06-29 MED ORDER — PHENYLEPHRINE HCL 10 MG/ML IJ SOLN
INTRAMUSCULAR | Status: DC | PRN
  Administered 2012-06-29 (×6): 40 ug via INTRAVENOUS

## 2012-06-29 MED ORDER — BUPIVACAINE LIPOSOME 1.3 % IJ SUSP
20.0000 mL | INTRAMUSCULAR | Status: DC
  Filled 2012-06-29 (×2): qty 20

## 2012-06-29 MED ORDER — MORPHINE SULFATE (PF) 1 MG/ML IV SOLN
INTRAVENOUS | Status: AC
  Administered 2012-06-29: 16:00:00
  Filled 2012-06-29: qty 25

## 2012-06-29 MED ORDER — OXYCODONE HCL 5 MG PO TABS
ORAL_TABLET | ORAL | Status: AC
  Filled 2012-06-29: qty 1

## 2012-06-29 MED ORDER — CEFAZOLIN SODIUM-DEXTROSE 2-3 GM-% IV SOLR
INTRAVENOUS | Status: AC
  Filled 2012-06-29: qty 50

## 2012-06-29 MED ORDER — SODIUM CHLORIDE 0.9 % IV SOLN: 250.0000 mL | INTRAVENOUS | Status: DC

## 2012-06-29 MED ORDER — ACETAMINOPHEN 10 MG/ML IV SOLN
1000.0000 mg | Freq: Once | INTRAVENOUS | Status: AC
  Administered 2012-06-29: 1000 mg via INTRAVENOUS
  Filled 2012-06-29: qty 100

## 2012-06-29 MED ORDER — GLYCOPYRROLATE 0.2 MG/ML IJ SOLN
INTRAMUSCULAR | Status: DC | PRN
  Administered 2012-06-29: .4 mg via INTRAVENOUS

## 2012-06-29 MED ORDER — OXYCODONE-ACETAMINOPHEN 5-325 MG PO TABS
1.0000 | ORAL_TABLET | ORAL | Status: DC | PRN
  Administered 2012-06-30 – 2012-07-06 (×23): 2 via ORAL
  Filled 2012-06-29 (×27): qty 2

## 2012-06-29 MED ORDER — HYDROMORPHONE HCL PF 1 MG/ML IJ SOLN
0.2500 mg | INTRAMUSCULAR | Status: DC | PRN
  Administered 2012-06-29 (×4): 0.5 mg via INTRAVENOUS

## 2012-06-29 MED ORDER — PHENOL 1.4 % MT LIQD: 1.0000 | OROMUCOSAL | Status: DC | PRN

## 2012-06-29 MED ORDER — DIAZEPAM 5 MG PO TABS
5.0000 mg | ORAL_TABLET | Freq: Four times a day (QID) | ORAL | Status: DC | PRN
  Administered 2012-06-29 – 2012-07-06 (×12): 5 mg via ORAL
  Filled 2012-06-29 (×11): qty 1

## 2012-06-29 MED ORDER — HYDROMORPHONE HCL PF 1 MG/ML IJ SOLN
INTRAMUSCULAR | Status: AC
  Filled 2012-06-29: qty 1

## 2012-06-29 MED ORDER — PROPOFOL 10 MG/ML IV BOLUS
INTRAVENOUS | Status: DC | PRN
  Administered 2012-06-29: 180 mg via INTRAVENOUS

## 2012-06-29 MED ORDER — NALOXONE HCL 0.4 MG/ML IJ SOLN: 0.4000 mg | INTRAMUSCULAR | Status: DC | PRN

## 2012-06-29 MED ORDER — ACETAMINOPHEN 10 MG/ML IV SOLN
INTRAVENOUS | Status: AC
  Filled 2012-06-29: qty 100

## 2012-06-29 MED ORDER — LIDOCAINE HCL (CARDIAC) 20 MG/ML IV SOLN
INTRAVENOUS | Status: DC | PRN
  Administered 2012-06-29: 50 mg via INTRAVENOUS

## 2012-06-29 MED ORDER — ESCITALOPRAM OXALATE 10 MG PO TABS
10.0000 mg | ORAL_TABLET | Freq: Every day | ORAL | Status: DC
  Administered 2012-07-01 – 2012-07-06 (×6): 10 mg via ORAL
  Filled 2012-06-29 (×7): qty 1

## 2012-06-29 MED ORDER — OXYCODONE HCL 5 MG PO TABS
5.0000 mg | ORAL_TABLET | Freq: Once | ORAL | Status: AC | PRN
  Administered 2012-06-29: 5 mg via ORAL

## 2012-06-29 MED ORDER — FENTANYL CITRATE 0.05 MG/ML IJ SOLN
INTRAMUSCULAR | Status: DC | PRN
  Administered 2012-06-29: 50 ug via INTRAVENOUS
  Administered 2012-06-29: 25 ug via INTRAVENOUS
  Administered 2012-06-29: 100 ug via INTRAVENOUS

## 2012-06-29 MED ORDER — ONDANSETRON HCL 4 MG/2ML IJ SOLN: 4.0000 mg | INTRAMUSCULAR | Status: DC | PRN

## 2012-06-29 MED ORDER — MENTHOL 3 MG MT LOZG: 1.0000 | LOZENGE | OROMUCOSAL | Status: DC | PRN

## 2012-06-29 MED ORDER — ONDANSETRON HCL 4 MG/2ML IJ SOLN
INTRAMUSCULAR | Status: DC | PRN
  Administered 2012-06-29: 4 mg via INTRAVENOUS

## 2012-06-29 MED ORDER — OXYCODONE HCL 5 MG/5ML PO SOLN: 5.0000 mg | Freq: Once | ORAL | Status: AC | PRN

## 2012-06-29 SURGICAL SUPPLY — 73 items
APL SKNCLS STERI-STRIP NONHPOA (GAUZE/BANDAGES/DRESSINGS) ×1
BENZOIN TINCTURE PRP APPL 2/3 (GAUZE/BANDAGES/DRESSINGS) ×2 IMPLANT
BLADE SURG ROTATE 9660 (MISCELLANEOUS) IMPLANT
BONE EQUIVA 5CC (Bone Implant) ×2 IMPLANT
BUR ACORN 6.0 (BURR) ×4 IMPLANT
BUR MATCHSTICK NEURO 3.0 LAGG (BURR) ×2 IMPLANT
CANISTER SUCTION 2500CC (MISCELLANEOUS) ×2 IMPLANT
CAP REVERE LOCKING (Cap) ×4 IMPLANT
CLOTH BEACON ORANGE TIMEOUT ST (SAFETY) ×2 IMPLANT
CONT SPEC 4OZ CLIKSEAL STRL BL (MISCELLANEOUS) ×4 IMPLANT
COVER BACK TABLE 24X17X13 BIG (DRAPES) IMPLANT
COVER TABLE BACK 60X90 (DRAPES) ×2 IMPLANT
DRAPE C-ARM 42X72 X-RAY (DRAPES) ×4 IMPLANT
DRAPE LAPAROTOMY 100X72X124 (DRAPES) ×2 IMPLANT
DRAPE POUCH INSTRU U-SHP 10X18 (DRAPES) ×2 IMPLANT
DRSG PAD ABDOMINAL 8X10 ST (GAUZE/BANDAGES/DRESSINGS) ×2 IMPLANT
DURAPREP 26ML APPLICATOR (WOUND CARE) ×2 IMPLANT
ELECT BLADE 4.0 EZ CLEAN MEGAD (MISCELLANEOUS) ×2
ELECT REM PT RETURN 9FT ADLT (ELECTROSURGICAL) ×2
ELECTRODE BLDE 4.0 EZ CLN MEGD (MISCELLANEOUS) ×1 IMPLANT
ELECTRODE REM PT RTRN 9FT ADLT (ELECTROSURGICAL) ×1 IMPLANT
EVACUATOR 1/8 PVC DRAIN (DRAIN) IMPLANT
GAUZE SPONGE 4X4 16PLY XRAY LF (GAUZE/BANDAGES/DRESSINGS) ×2 IMPLANT
GLOVE BIO SURGEON STRL SZ8 (GLOVE) ×2 IMPLANT
GLOVE BIOGEL M 8.0 STRL (GLOVE) ×4 IMPLANT
GLOVE BIOGEL PI IND STRL 7.0 (GLOVE) ×1 IMPLANT
GLOVE BIOGEL PI IND STRL 8 (GLOVE) ×3 IMPLANT
GLOVE BIOGEL PI INDICATOR 7.0 (GLOVE) ×1
GLOVE BIOGEL PI INDICATOR 8 (GLOVE) ×3
GLOVE ECLIPSE 7.5 STRL STRAW (GLOVE) ×6 IMPLANT
GLOVE EXAM NITRILE LRG STRL (GLOVE) IMPLANT
GLOVE EXAM NITRILE MD LF STRL (GLOVE) IMPLANT
GLOVE EXAM NITRILE XL STR (GLOVE) IMPLANT
GLOVE EXAM NITRILE XS STR PU (GLOVE) IMPLANT
GLOVE INDICATOR 8.5 STRL (GLOVE) ×2 IMPLANT
GOWN BRE IMP SLV AUR LG STRL (GOWN DISPOSABLE) IMPLANT
GOWN BRE IMP SLV AUR XL STRL (GOWN DISPOSABLE) ×6 IMPLANT
GOWN STRL REIN 2XL LVL4 (GOWN DISPOSABLE) ×4 IMPLANT
KIT BASIN OR (CUSTOM PROCEDURE TRAY) ×2 IMPLANT
KIT ROOM TURNOVER OR (KITS) ×2 IMPLANT
NEEDLE HYPO 18GX1.5 BLUNT FILL (NEEDLE) IMPLANT
NEEDLE HYPO 21X1.5 SAFETY (NEEDLE) ×2 IMPLANT
NEEDLE HYPO 25X1 1.5 SAFETY (NEEDLE) ×2 IMPLANT
NS IRRIG 1000ML POUR BTL (IV SOLUTION) ×2 IMPLANT
PACK LAMINECTOMY NEURO (CUSTOM PROCEDURE TRAY) ×2 IMPLANT
PAD ARMBOARD 7.5X6 YLW CONV (MISCELLANEOUS) ×10 IMPLANT
PATTIES SURGICAL .5 X1 (DISPOSABLE) ×2 IMPLANT
PATTIES SURGICAL .5 X3 (DISPOSABLE) IMPLANT
RASP 3.0MM (RASP) ×2 IMPLANT
ROD CURVED REVERE 6.35X50MM (Rod) ×2 IMPLANT
ROD REVERE 6.35 45MM (Rod) ×2 IMPLANT
SCREW DANEK NONBREAK (Screw) ×4 IMPLANT
SCREW REVERE 5.5X45 (Screw) ×4 IMPLANT
SENSORCAINE 0.5% WITH EPI 1:200,000 30ML IMPLANT
SPACER SUSTAIN O SM 8X22 10M (Spacer) ×4 IMPLANT
SPONGE GAUZE 4X4 12PLY (GAUZE/BANDAGES/DRESSINGS) ×2 IMPLANT
SPONGE LAP 4X18 X RAY DECT (DISPOSABLE) IMPLANT
SPONGE NEURO XRAY DETECT 1X3 (DISPOSABLE) IMPLANT
SPONGE SURGIFOAM ABS GEL 100 (HEMOSTASIS) ×2 IMPLANT
STRIP CLOSURE SKIN 1/2X4 (GAUZE/BANDAGES/DRESSINGS) ×2 IMPLANT
SUT VIC AB 1 CT1 18XBRD ANBCTR (SUTURE) ×2 IMPLANT
SUT VIC AB 1 CT1 8-18 (SUTURE) ×2
SUT VIC AB 2-0 CP2 18 (SUTURE) ×2 IMPLANT
SUT VIC AB 3-0 SH 8-18 (SUTURE) ×2 IMPLANT
SYR 20CC LL (SYRINGE) ×2 IMPLANT
SYR 20ML ECCENTRIC (SYRINGE) ×2 IMPLANT
SYR 5ML LL (SYRINGE) IMPLANT
TAPE CLOTH SURG 4X10 WHT LF (GAUZE/BANDAGES/DRESSINGS) ×2 IMPLANT
TOWEL OR 17X24 6PK STRL BLUE (TOWEL DISPOSABLE) ×2 IMPLANT
TOWEL OR 17X26 10 PK STRL BLUE (TOWEL DISPOSABLE) ×2 IMPLANT
TRAY FOLEY CATH 14FRSI W/METER (CATHETERS) ×2 IMPLANT
TUBE CONNECTING 12X1/4 (SUCTIONS) ×2 IMPLANT
WATER STERILE IRR 1000ML POUR (IV SOLUTION) ×2 IMPLANT

## 2012-06-29 NOTE — Progress Notes (Signed)
Op npte
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
## 2012-06-29 NOTE — Anesthesia Preprocedure Evaluation (Signed)
Anesthesia Evaluation  Patient identified by MRN, date of birth, ID band Patient awake    Reviewed: Allergy & Precautions, H&P , Patient's Chart, lab work & pertinent test results  Airway  TM Distance: >3 FB Neck ROM: Full    Dental   Pulmonary sleep apnea , former smoker,  breath sounds clear to auscultation        Cardiovascular hypertension, Rhythm:Regular Rate:Normal     Neuro/Psych Anxiety    GI/Hepatic hiatal hernia, GERD-  ,  Endo/Other    Renal/GU      Musculoskeletal   Abdominal (+) + obese,   Peds  Hematology   Anesthesia Other Findings   Reproductive/Obstetrics                           Anesthesia Physical Anesthesia Plan  ASA: III  Anesthesia Plan: General   Post-op Pain Management:    Induction: Intravenous  Airway Management Planned: Oral ETT  Additional Equipment:   Intra-op Plan:   Post-operative Plan: Extubation in OR  Informed Consent: I have reviewed the patients History and Physical, chart, labs and discussed the procedure including the risks, benefits and alternatives for the proposed anesthesia with the patient or authorized representative who has indicated his/her understanding and acceptance.     Plan Discussed with: CRNA and Surgeon  Anesthesia Plan Comments:         Anesthesia Quick Evaluation

## 2012-06-29 NOTE — Anesthesia Postprocedure Evaluation (Signed)
  Anesthesia Post-op Note  Patient: Madison Hines  Procedure(s) Performed: Procedure(s) (LRB) with comments: POSTERIOR LUMBAR FUSION 1 LEVEL (N/A) - Posterior Lumbar Three-Four Diskectomy/fusion/cages/pedicle screws/augmentation of fusion Lumbar Four-Sacral One  Patient Location: PACU  Anesthesia Type:General  Level of Consciousness: awake  Airway and Oxygen Therapy: Patient Spontanous Breathing  Post-op Pain: mild  Post-op Assessment: Post-op Vital signs reviewed, Patient's Cardiovascular Status Stable, Respiratory Function Stable, Patent Airway, No signs of Nausea or vomiting and Pain level controlled  Post-op Vital Signs: stable  Complications: No apparent anesthesia complications

## 2012-06-29 NOTE — Anesthesia Procedure Notes (Signed)
Procedure Name: Intubation Date/Time: 06/29/2012 11:46 AM Performed by: Luster Landsberg Pre-anesthesia Checklist: Patient identified, Emergency Drugs available, Suction available and Patient being monitored Patient Re-evaluated:Patient Re-evaluated prior to inductionOxygen Delivery Method: Circle system utilized Preoxygenation: Pre-oxygenation with 100% oxygen Intubation Type: IV induction Ventilation: Mask ventilation without difficulty and Oral airway inserted - appropriate to patient size Laryngoscope Size: Mac and 3 Grade View: Grade I Tube type: Oral Tube size: 7.5 mm Number of attempts: 1 Airway Equipment and Method: Stylet Placement Confirmation: ETT inserted through vocal cords under direct vision,  positive ETCO2 and breath sounds checked- equal and bilateral Secured at: 24 cm Tube secured with: Tape Dental Injury: Teeth and Oropharynx as per pre-operative assessment

## 2012-06-29 NOTE — Transfer of Care (Signed)
Immediate Anesthesia Transfer of Care Note  Patient: Madison Hines  Procedure(s) Performed: Procedure(s) (LRB) with comments: POSTERIOR LUMBAR FUSION 1 LEVEL (N/A) - Posterior Lumbar Three-Four Diskectomy/fusion/cages/pedicle screws/augmentation of fusion Lumbar Four-Sacral One  Patient Location: PACU  Anesthesia Type:General  Level of Consciousness: awake, alert  and oriented  Airway & Oxygen Therapy: Patient Spontanous Breathing and Patient connected to nasal cannula oxygen  Post-op Assessment: Report given to PACU RN, Post -op Vital signs reviewed and stable and Patient moving all extremities  Post vital signs: Reviewed and stable  Complications: No apparent anesthesia complications

## 2012-06-30 NOTE — Progress Notes (Signed)
Patient ID: Madison Hines, female   DOB: [birth year], 62 y.o.   MRN: [id number] Subjective:  The patient is alert and pleasant. Her back is appropriately sore.  Objective: Vital signs in last 24 hours: Temp:  [97.9 F (36.6 C)-98.3 F (36.8 C)] 98.2 F (36.8 C) (01/25 0518) Pulse Rate:  [80-107] 103  (01/25 0518) Resp:  [8-37] 18  (01/25 0518) BP: (99-136)/(39-69) 117/42 mmHg (01/25 0518) SpO2:  [98 %-100 %] 98 % (01/25 0518) FiO2 (%):  [80 %] 80 % (01/24 1826) Weight:  [83.462 kg (184 lb)] 83.462 kg (184 lb) (01/24 1828)  Intake/Output from previous day: 01/24 0701 - 01/25 0700 In: 2495 [I.V.:2400] Out: 1460 [Urine:1050; Drains:210; Blood:200] Intake/Output this shift:    Physical exam the patient is alert and oriented. She is moving her lower extremities well. Her dressing is clean and dry.  Lab Results: No results found for this basename: WBC:2,HGB:2,HCT:2,PLT:2 in the last 72 hours BMET No results found for this basename: NA:2,K:2,CL:2,CO2:2,GLUCOSE:2,BUN:2,CREATININE:2,CALCIUM:2 in the last 72 hours  Studies/Results: Dg Lumbar Spine 2-3 Views  06/29/2012  *RADIOLOGY REPORT*  Clinical Data: Extension of lumbar fusion to include L3-4.  OPERATIVE LUMBAR SPINE - 2-3 VIEW  Comparison: Intraoperative localization image performed earlier same date 1255 hours.  Findings: AP and lateral views of the lumbar spine obtained with the C-arm fluoroscopic device were submitted for interpretation post-operatively.  These demonstrate bilateral pedicle screws at L3 and L4 with interbody fusion plugs appropriately positioned in the L3-4 disc space.  The radiologic technologist documented 14 seconds of fluoroscopy time during the surgery.  IMPRESSION: Bilateral pedicle screws at L3 and L4 with appropriately positioned interbody fusion plugs in the L3-4 disc space.   Original Report Authenticated By: Hulan Saas, M.D.    Dg Lumbar Spine 1 View  06/29/2012  *RADIOLOGY REPORT*  Clinical Data:  Lumbar fusion.  L3-L4.  LUMBAR SPINE - 1 VIEW  Comparison: CT of 04/24/2012.  Findings: Single lateral view, labeled 1300 hours.  Patient status post three-level posterior fixation, L4-S1.  The surgical device projects at the L3-L4 interspace. Patient positioning degrades evaluation.  IMPRESSION: Intraoperative localization of L3-L4.   Original Report Authenticated By: Jeronimo Greaves, M.D.     Assessment/Plan: Postop day 1: We will mobilize the patient with PT and OT. We will continue her Hemovac drain in until tomorrow. We will likely discontinue the PCA pump tomorrow as well.  LOS: 1 day     Geovanie Winnett D 06/30/2012, 9:33 AM

## 2012-06-30 NOTE — Evaluation (Signed)
Physical Therapy Evaluation Patient Details Name: Madison Hines MRN: [id number] DOB: Feb 13, 1951 Today's Date: 06/30/2012 Time: 1135-1202 PT Time Calculation (min): 27 min  PT Assessment / Plan / Recommendation Clinical Impression  Pt s/p PLF L4-S1. Pt demonstrates deficits in functional mobility secondary to pain, weakness, and decreased activity tolerance. Patient will benefit from skilled PT to address deficits and maximize independence for d/c.    PT Assessment  Patient needs continued PT services    Follow Up Recommendations  Home health PT;Supervision - Intermittent          Equipment Recommendations  Rolling walker with 5" wheels (3 in 1 commode)    Recommendations for Other Services     Frequency Min 5X/week    Precautions / Restrictions Precautions Precautions: Back Precaution Comments: Educated pt on 3/3 back precautions.   Pertinent Vitals/Pain 8/10      Mobility  Bed Mobility Bed Mobility: Rolling Left;Left Sidelying to Sit;Sitting - Scoot to Edge of Bed Rolling Left: 3: Mod assist;With rail Left Sidelying to Sit: 1: +2 Total assist Left Sidelying to Sit: Patient Percentage: 60% Sitting - Scoot to Edge of Bed: 4: Min assist Details for Bed Mobility Assistance: Assist to support trunk and LEs OOB. VCs for sequencing and log roll technique.  Required increased time due to pain. Transfers Transfers: Sit to Stand;Stand to Sit Sit to Stand: 3: Mod assist;From bed;With upper extremity assist Stand to Sit: 4: Min assist;To chair/3-in-1;With armrests;With upper extremity assist Details for Transfer Assistance: Assist for power up from bed and to control descent to chair.  VC for safest hand placement. Required increased time due to pain. Ambulation/Gait Ambulation/Gait Assistance: 4: Min assist Ambulation Distance (Feet): 8 Feet Assistive device: Rolling walker Ambulation/Gait Assistance Details: Patient very rigid and anxious with ambulation secondary to pain;  VC's for encouragement.  Gait Pattern: Decreased stride length;Shuffle;Narrow base of support Gait velocity: decreased General Gait Details: Pt very slow with gait, limited by pain           PT Diagnosis: Difficulty walking;Generalized weakness;Abnormality of gait;Acute pain  PT Problem List: Decreased strength;Decreased range of motion;Decreased activity tolerance;Decreased mobility;Decreased knowledge of precautions;Pain PT Treatment Interventions: DME instruction;Gait training;Stair training;Functional mobility training;Therapeutic activities;Therapeutic exercise;Patient/family education   PT Goals Acute Rehab PT Goals PT Goal Formulation: With patient Time For Goal Achievement: 07/07/12 Potential to Achieve Goals: Good Pt will go Supine/Side to Sit: with modified independence PT Goal: Supine/Side to Sit - Progress: Goal set today Pt will go Sit to Supine/Side: with modified independence PT Goal: Sit to Supine/Side - Progress: Goal set today Pt will go Sit to Stand: with modified independence PT Goal: Sit to Stand - Progress: Goal set today Pt will go Stand to Sit: with modified independence PT Goal: Stand to Sit - Progress: Goal set today Pt will Stand: with modified independence PT Goal: Stand - Progress: Goal set today Pt will Ambulate: >150 feet;with modified independence;with rolling walker PT Goal: Ambulate - Progress: Goal set today  Visit Information  Last PT Received On: 06/30/12 Assistance Needed: +1    Subjective Data  Subjective: Patient agreeable to therapy Patient Stated Goal: to go home   Prior Functioning  Home Living Lives With: Alone Available Help at Discharge: Family;Available 24 hours/day Type of Home: Apartment Home Access: Level entry Home Layout: One level Bathroom Shower/Tub: Tub/shower unit;Curtain Firefighter: Standard Home Adaptive Equipment: None Prior Function Level of Independence: Independent Driving: Yes Vocation: Retired      IT consultant  Overall Cognitive Status:  Appears within functional limits for tasks assessed/performed Arousal/Alertness: Awake/alert Orientation Level: Appears intact for tasks assessed Behavior During Session: [facility] for tasks performed    Extremity/Trunk Assessment Right Upper Extremity Assessment RUE ROM/Strength/Tone: [facility] for tasks assessed Left Upper Extremity Assessment LUE ROM/Strength/Tone: [facility] for tasks assessed Right Lower Extremity Assessment RLE ROM/Strength/Tone: [facility] for tasks assessed Left Lower Extremity Assessment LLE ROM/Strength/Tone: [facility] for tasks assessed   Balance Balance Balance Assessed: Yes Static Standing Balance Static Standing - Balance Support: Bilateral upper extremity supported Static Standing - Level of Assistance: 4: Min assist Static Standing - Comment/# of Minutes: 2 minutes  End of Session PT - End of Session Equipment Utilized During Treatment: Gait belt Activity Tolerance: Patient limited by fatigue;Patient limited by pain Patient left: in chair;with call bell/phone within reach Nurse Communication: Mobility status  GP     Fabio Asa 06/30/2012, 3:03 PM Charlotte Crumb, PT DPT  [phone]
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
## 2012-06-30 NOTE — Evaluation (Signed)
Occupational Therapy Evaluation Patient Details Name: Madison Hines MRN: [id number] DOB: 05-Jun-1951 Today's Date: 06/30/2012 Time: 9528-4132 OT Time Calculation (min): 27 min  OT Assessment / Plan / Recommendation Clinical Impression  Pt s/p PLF L4-S1. Will benefit from acute OT services to address below problem list in prep for return home.    OT Assessment  Patient needs continued OT Services    Follow Up Recommendations  Home health OT;Supervision/Assistance - 24 hour    Barriers to Discharge None    Equipment Recommendations  3 in 1 bedside comode    Recommendations for Other Services    Frequency  Min 2X/week    Precautions / Restrictions Precautions Precautions: Back Precaution Comments: Educated pt on 3/3 back precautions.   Pertinent Vitals/Pain See vitals    ADL  Eating/Feeding: Independent;Performed Where Assessed - Eating/Feeding: Edge of bed Lower Body Bathing: Simulated;Maximal assistance Where Assessed - Lower Body Bathing: Supported sit to stand Lower Body Dressing: Simulated;Maximal assistance Where Assessed - Lower Body Dressing: Supported sit to Pharmacist, hospital: Simulated;Moderate assistance Toilet Transfer Method: Sit to stand Toilet Transfer Equipment: Other (comment) (bed ambulating to chair) Equipment Used: Gait belt;Rolling walker Transfers/Ambulation Related to ADLs: min assist with RW for ambulating in room.  Pt with very slow gait due to pain. ADL Comments: Pt requiring increased time for all tasks due to pain.  No order for back brace in chart, but brace could possibly help with comfort and pain during mobility/ADLs.      OT Diagnosis: Generalized weakness;Acute pain  OT Problem List: Decreased strength;Decreased activity tolerance;Decreased knowledge of use of DME or AE;Decreased knowledge of precautions;Pain OT Treatment Interventions: Self-care/ADL training;DME and/or AE instruction;Therapeutic activities;Patient/family education    OT Goals Acute Rehab OT Goals OT Goal Formulation: With patient Time For Goal Achievement: 07/07/12 Potential to Achieve Goals: Good ADL Goals Pt Will Perform Grooming: with supervision;Standing at sink ADL Goal: Grooming - Progress: Goal set today Pt Will Perform Lower Body Bathing: with supervision;Sit to stand from chair;Sit to stand from bed;with adaptive equipment ADL Goal: Lower Body Bathing - Progress: Goal set today Pt Will Perform Lower Body Dressing: with supervision;Sit to stand from chair;Sit to stand from bed;with adaptive equipment ADL Goal: Lower Body Dressing - Progress: Goal set today Pt Will Transfer to Toilet: with supervision;Ambulation;with DME;Comfort height toilet;Maintaining back safety precautions ADL Goal: Toilet Transfer - Progress: Goal set today Pt Will Perform Toileting - Clothing Manipulation: with supervision;Standing;with adaptive equipment ADL Goal: Toileting - Clothing Manipulation - Progress: Goal set today Pt Will Perform Toileting - Hygiene: with supervision;Sit to stand from 3-in-1/toilet;with adaptive equipment ADL Goal: Toileting - Hygiene - Progress: Goal set today Pt Will Perform Tub/Shower Transfer: Tub transfer;with supervision;Ambulation;with DME;Shower seat with back;Maintaining back safety precautions ADL Goal: Tub/Shower Transfer - Progress: Goal set today Miscellaneous OT Goals Miscellaneous OT Goal #1: Pt will perform bed mobility at supervision level as precursor for EOB ADLs. OT Goal: Miscellaneous Goal #1 - Progress: Goal set today  Visit Information  Last OT Received On: 06/30/12 PT/OT Co-Evaluation/Treatment: Yes    Subjective Data      Prior Functioning     Home Living Lives With: Alone Available Help at Discharge: Family;Available 24 hours/day Type of Home: Apartment Home Access: Level entry Home Layout: One level Bathroom Shower/Tub: Tub/shower unit;Curtain Firefighter: Standard Home Adaptive Equipment:  None Prior Function Level of Independence: Independent Driving: Yes Vocation: Retired         Museum/gallery exhibitions officer  Overall Cognitive Status: Appears within functional limits for tasks assessed/performed Arousal/Alertness: Awake/alert Orientation Level: Appears intact for tasks assessed Behavior During Session: [facility] for tasks performed    Extremity/Trunk Assessment Right Upper Extremity Assessment RUE ROM/Strength/Tone: [facility] for tasks assessed Left Upper Extremity Assessment LUE ROM/Strength/Tone: [facility] for tasks assessed     Mobility Bed Mobility Bed Mobility: Rolling Left;Left Sidelying to Sit;Sitting - Scoot to Edge of Bed Rolling Left: 3: Mod assist;With rail Left Sidelying to Sit: 1: +2 Total assist Left Sidelying to Sit: Patient Percentage: 60% Sitting - Scoot to Edge of Bed: 4: Min assist Details for Bed Mobility Assistance: Assist to support trunk and LEs OOB. VCs for sequencing and log roll technique.  Required increased time due to pain. Transfers Transfers: Sit to Stand;Stand to Sit Sit to Stand: 3: Mod assist;From bed;With upper extremity assist Stand to Sit: 4: Min assist;To chair/3-in-1;With armrests;With upper extremity assist Details for Transfer Assistance: Assist for power up from bed and to control descent to chair.  VC for safest hand placement. Required increased time due to pain.     Shoulder Instructions     Exercise     Balance     End of Session OT - End of Session Equipment Utilized During Treatment: Gait belt Activity Tolerance: Patient limited by fatigue;Patient limited by pain Patient left: in chair;with call bell/phone within reach;with nursing in room Nurse Communication: Mobility status  GO    06/30/2012 Cipriano Mile OTR/L Pager [phone] Office [phone]  Cipriano Mile 06/30/2012, 2:53 PM

## 2012-07-01 MED ORDER — HYDROMORPHONE HCL PF 1 MG/ML IJ SOLN: 0.5000 mg | INTRAMUSCULAR | Status: DC | PRN

## 2012-07-01 NOTE — Progress Notes (Signed)
Orthopedic Tech Progress Note Patient Details:  Madison Hines 1950/12/11 [id number]  Patient ID: Madison Hines, female   DOB: 1950/07/23, 62 y.o.   MRN: [id number]   [name] 07/01/2012, 10:45 AM Called bio-tech for lumbar corset.

## 2012-07-01 NOTE — Progress Notes (Signed)
Physical Therapy Treatment Patient Details Name: Madison Hines MRN: [id number] DOB: 1951-05-14 Today's Date: 07/01/2012 Time: 4132-4401 PT Time Calculation (min): 30 min  PT Assessment / Plan / Recommendation Comments on Treatment Session  62 yo continues to progress with therapy, still moving slow with anticipated soreness.  Expect will be ready for d/c home with [facility] in next few days.  Asked nursing to assist with shorter, more frequent walks to speed recovery.  Pt agreeable.  Continuing.    Follow Up Recommendations  Home health PT;Supervision - Intermittent     Does the patient have the potential to tolerate intense rehabilitation     Barriers to Discharge        Equipment Recommendations  Rolling walker with 5" wheels    Recommendations for Other Services    Frequency Min 5X/week   Plan Discharge plan remains appropriate;Frequency remains appropriate    Precautions / Restrictions Precautions Precautions: Back Precaution Comments: reminder not to twist, wear brace when up in chair, pillow behind to keep back supported; walk multiple short bouts, sit <60 min bouts, rest in bed with back aligned Required Braces or Orthoses: Spinal Brace (lumbar corset ordered/delivered today) Spinal Brace: Lumbar corset;Applied in sitting position   Pertinent Vitals/Pain Sore in back as expected.  RN gave mm relaxant after session.    Mobility  Bed Mobility Bed Mobility: Rolling Right;Right Sidelying to Sit;Sitting - Scoot to [place] of Bed Rolling Right: 3: Mod assist Right Sidelying to Sit: 2: Max assist;With rails;HOB flat Sitting - Scoot to Edge of Bed: 3: Mod assist (bed pad to initiate scoot out, then able to [employer]) Details for Bed Mobility Assistance: assist to keep trunk aligned, to push up into sitting, does not use momentum well yet into sitting; confirmational cues and encouragement to scoot out;  grimacing due to back soreness and takes incr time to move into  sitting Transfers Transfers: Sit to Stand;Stand to Sit Sit to Stand: 3: Mod assist;From elevated surface;From bed Stand to Sit: 4: Min assist;Without upper extremity assist;To chair/3-in-1;To toilet Details for Transfer Assistance: hands on assist to achieve lift off, using rw for leverage (rail down, bed surface elevated); of note, patient stood from elevated toilet with armrest physically independent.  Guidance for safest alignment with sitting surface and to control descent. Ambulation/Gait Ambulation/Gait Assistance: 5: Supervision Ambulation Distance (Feet): 225 Feet Assistive device: Rolling walker Ambulation/Gait Assistance Details: cues for upright posture, goal to decrease dependency on RW, to increase stride length and vary speed. Gait Pattern: Step-through pattern;Decreased stride length;Narrow base of support;Trunk flexed Gait velocity: 0.8 to 1.0 ft/sec -- goal is >2.5-3.0 ft/sec to indicate improved mobility    Exercises     PT Diagnosis:    PT Problem List:   PT Treatment Interventions:     PT Goals Acute Rehab PT Goals PT Goal: Supine/Side to Sit - Progress: Progressing toward goal PT Goal: Sit to Supine/Side - Progress: Progressing toward goal PT Goal: Sit to Stand - Progress: Progressing toward goal PT Goal: Stand to Sit - Progress: Progressing toward goal PT Goal: Stand - Progress: Progressing toward goal PT Goal: Ambulate - Progress: Progressing toward goal  Visit Information  Last PT Received On: 07/01/12 Assistance Needed: +1    Subjective Data  Subjective: I need to get washed up   Cognition  Overall Cognitive Status: Appears within functional limits for tasks assessed/performed Arousal/Alertness: Awake/alert Orientation Level: Appears intact for tasks assessed Behavior During Session: [facility] for tasks performed Cognition - Other Comments:  mildly lethargic initially likely due to meds, perked up with activity    Balance     End of Session PT - End of  Session Equipment Utilized During Treatment: Gait belt;Back brace Activity Tolerance: Patient tolerated treatment well (motivated this session despite soreness in back) Patient left: in bed;with call bell/phone within reach;with nursing in room Nurse Communication: Mobility status;Patient requests pain meds   GP     Dennis Bast 07/01/2012, 3:46 PM

## 2012-07-01 NOTE — Progress Notes (Signed)
Orthopedic Tech Progress Note Patient Details:  Madison Hines [birth year] [id number]  Patient ID: [name], female   DOB: January 05, 1951, 63 y.o.   MRN: [id number]   [name] 07/01/2012, 12:47 PM Lumbar corset completed by bio-tech.

## 2012-07-01 NOTE — Progress Notes (Signed)
Subjective: Patient reports She's feeling well leg pain is improved back pain is getting better she wants to get rid of the PCA  Objective: Vital signs in last 24 hours: Temp:  [98.9 F (37.2 C)-100.8 F (38.2 C)] 99.1 F (37.3 C) (01/26 0524) Pulse Rate:  [93-105] 93  (01/26 0524) Resp:  [15-18] 17  (01/26 0524) BP: (107-137)/(42-58) 124/58 mmHg (01/26 0524) SpO2:  [96 %-100 %] 98 % (01/26 0524) FiO2 (%):  [100 %] 100 % (01/25 1555)  Intake/Output from previous day: 01/25 0701 - 01/26 0700 In: -  Out: 2050 [Urine:2000; Drains:50] Intake/Output this shift:    Awake alert oriented wound is clean and dry strength is 5 out of 5  Lab Results: No results found for this basename: WBC:2,HGB:2,HCT:2,PLT:2 in the last 72 hours BMET No results found for this basename: NA:2,K:2,CL:2,CO2:2,GLUCOSE:2,BUN:2,CREATININE:2,CALCIUM:2 in the last 72 hours  Studies/Results: Dg Lumbar Spine 2-3 Views  06/29/2012  *RADIOLOGY REPORT*  Clinical Data: Extension of lumbar fusion to include L3-4.  OPERATIVE LUMBAR SPINE - 2-3 VIEW  Comparison: Intraoperative localization image performed earlier same date 1255 hours.  Findings: AP and lateral views of the lumbar spine obtained with the C-arm fluoroscopic device were submitted for interpretation post-operatively.  These demonstrate bilateral pedicle screws at L3 and L4 with interbody fusion plugs appropriately positioned in the L3-4 disc space.  The radiologic technologist documented 14 seconds of fluoroscopy time during the surgery.  IMPRESSION: Bilateral pedicle screws at L3 and L4 with appropriately positioned interbody fusion plugs in the L3-4 disc space.   Original Report Authenticated By: Hulan Saas, M.D.    Dg Lumbar Spine 1 View  06/29/2012  *RADIOLOGY REPORT*  Clinical Data: Lumbar fusion.  L3-L4.  LUMBAR SPINE - 1 VIEW  Comparison: CT of 04/24/2012.  Findings: Single lateral view, labeled 1300 hours.  Patient status post three-level posterior  fixation, L4-S1.  The surgical device projects at the L3-L4 interspace. Patient positioning degrades evaluation.  IMPRESSION: Intraoperative localization of L3-L4.   Original Report Authenticated By: Jeronimo Greaves, M.D.     Assessment/Plan: Progressive mobilization today DC her PCA Foley is been DC'd   LOS: 2 days     Madison Hines P 07/01/2012, 9:33 AM

## 2012-07-01 NOTE — Clinical Social Work Note (Signed)
CSW consult for SNF. PT recommendation for HHPT with intermittent supervision noted. CSW signing off as no other CSW needs identified. Please re-consult if CSW needs arise.  Dellie Burns, MSW, LCSWA [phone] (Weekends 8:00am-4:30pm)

## 2012-07-02 MED ORDER — BISACODYL 10 MG RE SUPP
10.0000 mg | Freq: Every day | RECTAL | Status: DC | PRN
  Filled 2012-07-02: qty 1

## 2012-07-02 MED FILL — Sodium Chloride IV Soln 0.9%: INTRAVENOUS | Qty: 1000 | Status: AC

## 2012-07-02 MED FILL — Sodium Chloride IV Soln 0.9%: INTRAVENOUS | Qty: 2000 | Status: AC

## 2012-07-02 MED FILL — Heparin Sodium (Porcine) Inj 1000 Unit/ML: INTRAMUSCULAR | Qty: 30 | Status: AC

## 2012-07-02 NOTE — Progress Notes (Signed)
UR COMPLETED  

## 2012-07-02 NOTE — Progress Notes (Signed)
Patient ID: Madison Hines, female   DOB: 04-05-1951, 62 y.o.   MRN: [id number] C/o incisional pain,no weakness. Ambulating with help

## 2012-07-02 NOTE — Progress Notes (Signed)
Attempted to see pt today but pt in middle of lunch.  Will attempt back as schedule allows. Tory Emerald, [place] [phone]

## 2012-07-02 NOTE — Progress Notes (Signed)
Physical Therapy Treatment Patient Details Name: Madison Hines MRN: [id number] DOB: Nov 01, 1950 Today's Date: 07/02/2012 Time: 6962-9528 PT Time Calculation (min): 25 min  PT Assessment / Plan / Recommendation Comments on Treatment Session  Pt continues to make steady progress towards PT goals. Anticipate pt will be safe for d/c home after next session.    Follow Up Recommendations  Home health PT;Supervision - Intermittent     Does the patient have the potential to tolerate intense rehabilitation     Barriers to Discharge        Equipment Recommendations  Rolling walker with 5" wheels    Recommendations for Other Services    Frequency Min 5X/week   Plan Discharge plan remains appropriate;Frequency remains appropriate    Precautions / Restrictions Precautions Precautions: Back Precaution Comments: reminder not to twist, wear brace when up in chair, pillow behind to keep back supported; walk multiple short bouts, sit <60 min bouts, rest in bed with back aligned Required Braces or Orthoses: Spinal Brace   Pertinent Vitals/Pain "10/10" no evidence of distress visible    Mobility  Bed Mobility Bed Mobility: Rolling Right;Right Sidelying to Sit;Sitting - Scoot to [place] of Bed Rolling Right: 4: Min guard (with VC) Right Sidelying to Sit: 4: Min guard Sitting - Scoot to [place] of Bed: 4: Min guard Details for Bed Mobility Assistance: VC's for proper technique for log roll and back precaution compliance (performed multiple trials to reinforce technique ) Transfers Transfers: Sit to Stand;Stand to Sit Sit to Stand: 4: Min guard;From bed;From toilet Stand to Sit: 4: Min guard;To chair/3-in-1 Ambulation/Gait Ambulation/Gait Assistance: 5: Supervision Ambulation Distance (Feet): 200 Feet Assistive device: Rolling walker Ambulation/Gait Assistance Details: VC's for upright posture Gait Pattern: Step-through pattern;Decreased stride length;Narrow base of support;Trunk flexed Gait  velocity: decreased General Gait Details: Pt continues with slow gait speed, pt with increased "soreness" pain today.  Patient with fatigue today secondary to using bathroom at the beginning of session and performing multiple transfers prior to begining ambulation.    Exercises     PT Diagnosis:    PT Problem List:   PT Treatment Interventions:     PT Goals Acute Rehab PT Goals PT Goal Formulation: With patient PT Goal: Supine/Side to Sit - Progress: Progressing toward goal PT Goal: Sit to Supine/Side - Progress: Progressing toward goal PT Goal: Sit to Stand - Progress: Progressing toward goal PT Goal: Stand to Sit - Progress: Progressing toward goal PT Goal: Stand - Progress: Progressing toward goal PT Goal: Ambulate - Progress: Progressing toward goal  Visit Information  Last PT Received On: 07/02/12    Subjective Data  Subjective: I'm very sore today Patient Stated Goal: to go home   Cognition  Overall Cognitive Status: Appears within functional limits for tasks assessed/performed Arousal/Alertness: Awake/alert Orientation Level: Appears intact for tasks assessed Behavior During Session: [facility] for tasks performed    Balance  Balance Balance Assessed: Yes Dynamic Standing Balance Dynamic Standing - Balance Support: No upper extremity supported;During functional activity Dynamic Standing - Level of Assistance: 5: Stand by assistance Dynamic Standing - Balance Activities: Reaching for objects (hand hygiene)  End of Session PT - End of Session Equipment Utilized During Treatment: Gait belt;Back brace Activity Tolerance: Patient tolerated treatment well Patient left: in chair;with call bell/phone within reach Nurse Communication: Mobility status;Patient requests pain meds   GP     Fabio Asa 07/02/2012, 10:06 AM Charlotte Crumb, PT DPT  [phone]

## 2012-07-03 MED ORDER — OXYCODONE HCL 5 MG PO TABS
10.0000 mg | ORAL_TABLET | ORAL | Status: DC | PRN
  Administered 2012-07-03 – 2012-07-06 (×9): 10 mg via ORAL
  Filled 2012-07-03 (×9): qty 2

## 2012-07-03 MED ORDER — BISACODYL 10 MG RE SUPP: 10.0000 mg | Freq: Every day | RECTAL | Status: DC | PRN

## 2012-07-03 NOTE — Progress Notes (Signed)
Physical Therapy Treatment Patient Details Name: Madison Hines MRN: [id number] DOB: 02-03-1951 Today's Date: 07/03/2012 Time: 4098-1191 PT Time Calculation (min): 26 min  PT Assessment / Plan / Recommendation Comments on Treatment Session  Pt in significantly more pain today and has been running a temperature. Pt limited in activity tolerance, will attempt to progress activity further tomorrow.    Follow Up Recommendations  Home health PT;Supervision - Intermittent           Equipment Recommendations  Rolling walker with 5" wheels       Frequency Min 5X/week   Plan Discharge plan remains appropriate;Frequency remains appropriate    Precautions / Restrictions Precautions Precautions: Back Precaution Comments: reminder not to twist, wear brace when up in chair, pillow behind to keep back supported; walk multiple short bouts, sit <60 min bouts, rest in bed with back aligned Required Braces or Orthoses: Spinal Brace Spinal Brace: Lumbar corset;Applied in sitting position   Pertinent Vitals/Pain 10/10 reported; vitals stable    Mobility  Bed Mobility Bed Mobility: Rolling Right;Right Sidelying to Sit;Sitting - Scoot to Edge of Bed Rolling Right: 4: Min assist Right Sidelying to Sit: 4: Min assist;HOB elevated Sitting - Scoot to [place] of Bed: 4: Min guard Details for Bed Mobility Assistance: VC's for proper technique with precautions Transfers Transfers: Sit to Stand;Stand to Sit Sit to Stand: 4: Min guard Stand to Sit: 4: Min guard;To chair/3-in-1 Ambulation/Gait Ambulation/Gait Assistance: 5: Supervision Ambulation Distance (Feet): 15 Feet (to and from bathroom) Assistive device: Rolling walker     PT Goals Acute Rehab PT Goals PT Goal: Stand - Progress: Progressing toward goal  Visit Information  Last PT Received On: 07/03/12 Assistance Needed: +1    Subjective Data  Subjective: I am not doing good, the pain is so much worse Patient Stated Goal: to go home     Cognition  Overall Cognitive Status: Appears within functional limits for tasks assessed/performed Arousal/Alertness: Awake/alert Orientation Level: Appears intact for tasks assessed Behavior During Session: [facility] for tasks performed    Balance  Dynamic Standing Balance Dynamic Standing - Balance Support: No upper extremity supported;During functional activity Dynamic Standing - Level of Assistance: 6: Modified independent (Device/Increase time) Dynamic Standing - Balance Activities: Reaching for objects Dynamic Standing - Comments: Pt steady able to stand reach and perform activities at the countertop including hand hygiene, dental hygiene and grooming.  End of Session PT - End of Session Equipment Utilized During Treatment: Gait belt;Back brace Activity Tolerance: Patient tolerated treatment well;Patient limited by pain Patient left: in bed;with call bell/phone within reach Nurse Communication: Mobility status;Patient requests pain meds   GP     Fabio Asa 07/03/2012, 4:08 PM Charlotte Crumb, PT DPT  [phone]

## 2012-07-03 NOTE — Progress Notes (Signed)
Patient ID: Madison Hines, female   DOB: 07-Jan-1951, 62 y.o.   MRN: [id number] C/o incisional pain., no weakness. constipation

## 2012-07-03 NOTE — Progress Notes (Signed)
   CARE MANAGEMENT NOTE 07/03/2012  Patient:  JIZELLE, CONKEY   Account Number:  [id number]  Date Initiated:  07/02/2012  Documentation initiated by:  [facility]  Subjective/Objective Assessment:   admitted postop L3-4 PLIF     Action/Plan:   PT/OT evals   Anticipated DC Date:  07/05/2012   Anticipated DC Plan:  HOME W HOME HEALTH SERVICES      DC Planning Services  CM consult      [facility] Choice  HOME HEALTH   Choice offered to / List presented to:  C-1 Patient   DME arranged  3-N-1  Levan Hurst      DME agency  [facility]     HH arranged  HH-2 PT  HH-3 OT      [facility] agency  [facility]   Status of service:  Completed, signed off Medicare Important Message given?   (If response is "NO", the following Medicare IM given date fields will be blank) Date Medicare IM given:   Date Additional Medicare IM given:    Discharge Disposition:  HOME W HOME HEALTH SERVICES  Per UR Regulation:  Reviewed for med. necessity/level of care/duration of stay  If discussed at Long Length of Stay Meetings, dates discussed:    Comments:  07/03/2012 1200 NCM spoke to pt and she wants [place] for [facility]. Notified agency of referral. They can accept referral. Pt states he sister will be there at home to assist with her care. She is requesting RW and 3n1 for home. Notified DME rep for DME. Isidoro Donning RN CCM Case Mgmt phone [phone]

## 2012-07-04 ENCOUNTER — Encounter: Payer: Self-pay | Admitting: Internal Medicine

## 2012-07-04 NOTE — Progress Notes (Signed)
Physical Therapy Treatment Patient Details Name: Madison Hines MRN: [id number] DOB: [birth year] Today's Date: 07/04/2012 Time: 4098-1191 PT Time Calculation (min): 27 min  PT Assessment / Plan / Recommendation Comments on Treatment Session  Pt continues to present with significant pain (reported 9/10).  Patient slow with ambulation despite VC's and encouragement. Pt concerned that her sister will not be able to assist her as needed upon discharge. Pt still requiring assist for bed mobility and self limiting activity tolerance secondary to pain. Will continue to see acutely to address deficits and advance activity as tolerated.     Follow Up Recommendations  SNF (vc HHPT with supervision and PRN assist. )     Does the patient have the potential to tolerate intense rehabilitation     Barriers to Discharge  Pt was to have sister for assist, pt now stating that she does not feel sister can provide the assist she needs to go home.            Frequency Min 5X/week   Plan Discharge plan needs to be updated    Precautions / Restrictions Precautions Precautions: Back Precaution Comments: reminder not to twist, wear brace when up in chair, pillow behind to keep back supported; walk multiple short bouts, sit <60 min bouts, rest in bed with back aligned Required Braces or Orthoses: Spinal Brace Spinal Brace: Lumbar corset;Applied in sitting position   Pertinent Vitals/Pain 9/10 reported    Mobility  Bed Mobility Bed Mobility: Rolling Right;Right Sidelying to Sit;Sitting - Scoot to [place] of Bed Rolling Right: 4: Min assist Right Sidelying to Sit: 4: Min assist;HOB elevated Sitting - Scoot to [place] of Bed: 4: Min guard Details for Bed Mobility Assistance: Patient still requiring assist to get up from bed; no progressing secondary to pain Transfers Transfers: Sit to Stand;Stand to Sit Sit to Stand: 4: Min guard Stand to Sit: 4: Min guard;To chair/3-in-1 Details for Transfer Assistance: Pt  still initially unsteady and requires increased time to perform Ambulation/Gait Ambulation/Gait Assistance: 5: Supervision Ambulation Distance (Feet): 120 Feet Assistive device: Rolling walker Ambulation/Gait Assistance Details: VC's for upright posture; patient very slow gait with multiple stops secondary to patient reported muscle spasms Gait Pattern: Step-through pattern;Decreased stride length;Narrow base of support;Trunk flexed Gait velocity: decreased General Gait Details: Pt with limited ambulation secondary to pain Stairs: No      PT Goals Acute Rehab PT Goals PT Goal: Supine/Side to Sit - Progress: Not progressing PT Goal: Sit to Stand - Progress: Progressing toward goal PT Goal: Stand to Sit - Progress: Progressing toward goal Pt will Ambulate: >150 feet;with modified independence;with rolling walker PT Goal: Ambulate - Progress: Not progressing  Visit Information  Last PT Received On: 07/04/12 Assistance Needed: +1    Subjective Data  Subjective: I am not feeling any better   Cognition  Overall Cognitive Status: Appears within functional limits for tasks assessed/performed Arousal/Alertness: Awake/alert Orientation Level: Appears intact for tasks assessed Behavior During Session: [facility] for tasks performed       End of Session PT - End of Session Equipment Utilized During Treatment: Gait belt;Back brace Activity Tolerance: Patient limited by pain Patient left:  (on commode; nsg aware pt wishes to try and have BM ) Nurse Communication: Mobility status;Patient requests pain meds   GP     Fabio Asa 07/04/2012, 10:40 AM Charlotte Crumb, PT DPT  [phone]

## 2012-07-04 NOTE — Progress Notes (Signed)
Patient ID: Madison Hines, female   DOB: [birth year], 62 y.o.   MRN: [id number] Still having incisional pain, no weakness. Constipated. Rehabilitation to see

## 2012-07-04 NOTE — Progress Notes (Signed)
Occupational Therapy Treatment Patient Details Name: Madison Hines MRN: [id number] DOB: March 24, 1951 Today's Date: 07/04/2012 Time: 9562-1308 OT Time Calculation (min): 43 min  OT Assessment / Plan / Recommendation Comments on Treatment Session Pt moves slowly, but with min to min guard assist.  Reports pain interfering with movement.  Pt now requesting SNF, does not feel her sister can care for her at home at the level she requires.    Follow Up Recommendations  Home health OT;Supervision/Assistance - 24 hour (vs SNF if inadequate support)    Barriers to Discharge       Equipment Recommendations  3 in 1 bedside comode    Recommendations for Other Services    Frequency Min 2X/week   Plan Discharge plan remains appropriate    Precautions / Restrictions Precautions Precautions: Back;Fall Precaution Comments: pt able to state 3 of 3 back precautions Required Braces or Orthoses: Spinal Brace Spinal Brace: Lumbar corset;Applied in sitting position   Pertinent Vitals/Pain 10/10 back, reported to RN, repositioned    ADL  Grooming: Min guard;Brushing hair;Wash/dry hands Where Assessed - Grooming: Unsupported standing Lower Body Dressing: Maximal assistance (pt instructed in use of AE, is familiar from previous sx) Where Assessed - Lower Body Dressing: Unsupported sitting;Supported sit to stand Toilet Transfer: Min Pension scheme manager Method: Sit to Barista: Raised toilet seat with arms (or 3-in-1 over toilet) Toileting - Clothing Manipulation and Hygiene: Modified independent Where Assessed - Glass blower/designer Manipulation and Hygiene: Standing Equipment Used: Gait belt;Rolling walker;Back brace Transfers/Ambulation Related to ADLs: min guard for ambulation with RW ADL Comments: Pt able to don her back brace with set up.  Instruction in pericare after BM to avoid twisting.    OT Diagnosis:    OT Problem List:   OT Treatment Interventions:     OT  Goals ADL Goals Pt Will Perform Grooming: with supervision;Standing at sink ADL Goal: Grooming - Progress: Met Pt Will Perform Lower Body Bathing: with supervision;Sit to stand from chair;Sit to stand from bed;with adaptive equipment Pt Will Perform Lower Body Dressing: with supervision;Sit to stand from chair;Sit to stand from bed;with adaptive equipment Pt Will Transfer to Toilet: with supervision;Ambulation;with DME;Comfort height toilet;Maintaining back safety precautions ADL Goal: Toilet Transfer - Progress: Progressing toward goals Pt Will Perform Toileting - Clothing Manipulation: with supervision;Standing;with adaptive equipment ADL Goal: Toileting - Clothing Manipulation - Progress: Met Pt Will Perform Toileting - Hygiene: with supervision;Sit to stand from 3-in-1/toilet;with adaptive equipment ADL Goal: Toileting - Hygiene - Progress: Met Pt Will Perform Tub/Shower Transfer: Tub transfer;with supervision;Ambulation;with DME;Shower seat with back;Maintaining back safety precautions Miscellaneous OT Goals Miscellaneous OT Goal #1: Pt will perform bed mobility at supervision level as precursor for EOB ADLs. OT Goal: Miscellaneous Goal #1 - Progress: Progressing toward goals  Visit Information  Last OT Received On: 07/04/12 Assistance Needed: +1    Subjective Data      Prior Functioning       Cognition  Overall Cognitive Status: Appears within functional limits for tasks assessed/performed Arousal/Alertness: Lethargic Orientation Level: Appears intact for tasks assessed Behavior During Session: Lethargic Cognition - Other Comments: lethargy may be med related    Mobility  Shoulder Instructions Bed Mobility Bed Mobility: Rolling Right;Right Sidelying to Sit;Sit to Sidelying Right;Sitting - Scoot to Edge of Bed Rolling Right: 5: Supervision Right Sidelying to Sit: 5: Supervision;With rails;HOB elevated Sitting - Scoot to Edge of Bed: 5: Supervision Sit to Sidelying Right:  4: Min assist (for LEs) Details for Bed Mobility  Assistance: Very slow Transfers Transfers: Sit to Stand;Stand to Sit Sit to Stand: 4: Min guard;With upper extremity assist;From bed;6: Modified independent (Device/Increase time);From chair/3-in-1 Stand to Sit: 4: Min guard;With upper extremity assist;To chair/3-in-1;To bed Details for Transfer Assistance: Performs with increased time.       Exercises      Balance     End of Session OT - End of Session Equipment Utilized During Treatment: Gait belt;Back brace Activity Tolerance: Patient limited by fatigue;Patient limited by pain Patient left: in bed;with call bell/phone within reach;with bed alarm set Nurse Communication: Patient requests pain meds  GO     Evern Bio 07/04/2012, 1:31 PM [phone]

## 2012-07-05 NOTE — Progress Notes (Signed)
Patient ID: Madison Hines, female   DOB: 1951/01/02, 62 y.o.   MRN: [id number] Ambulating, no leg discomfort. wants to go home in am . She will have family members to help

## 2012-07-05 NOTE — Progress Notes (Signed)
Physical Therapy Treatment Patient Details Name: Madison Hines MRN: [id number] DOB: Sep 10, 1950 Today's Date: 07/05/2012 Time: 6213-0865 PT Time Calculation (min): 25 min  PT Assessment / Plan / Recommendation Comments on Treatment Session  Pt making continuous progress for discharge home tomorrow.  Patient completed all education and has made arrangements with friends who are CNAs and a nurse to come stay with her when she leaves tomorrow. Anticipate patient will be safe for d/c home with HHPT to follow.    Follow Up Recommendations  Home health PT;Supervision - Intermittent                 Frequency Min 5X/week   Plan Discharge plan needs to be updated    Precautions / Restrictions Precautions Precautions: Back Precaution Comments: reminder not to twist, wear brace when up in chair, pillow behind to keep back supported; walk multiple short bouts, sit <60 min bouts, rest in bed with back aligned Required Braces or Orthoses: Spinal Brace Spinal Brace: Lumbar corset;Applied in sitting position   Pertinent Vitals/Pain 9/10; not visible evidence of distress    Mobility  Bed Mobility Bed Mobility: Rolling Right;Right Sidelying to Sit;Sitting - Scoot to Edge of Bed Rolling Right: 5: Supervision Right Sidelying to Sit: 5: Supervision;With rails;HOB elevated Sitting - Scoot to Edge of Bed: 5: Supervision Sit to Sidelying Right: 4: Min assist Details for Bed Mobility Assistance: assist to elevate legs into bed; VCs for technique Transfers Transfers: Sit to Stand;Stand to Sit Sit to Stand: 5: Supervision Stand to Sit: 4: Min guard;To chair/3-in-1;5: Supervision Details for Transfer Assistance: pt requires increased time to perform Ambulation/Gait Ambulation/Gait Assistance: 5: Supervision Ambulation Distance (Feet): 260 Feet Assistive device: Rolling walker Ambulation/Gait Assistance Details: VC's for pacing and precautions Gait Pattern: Step-through pattern;Decreased stride  length;Narrow base of support;Trunk flexed Gait velocity: decreased General Gait Details: Pt with limited ambulation secondary to pain Stairs: Yes Stairs Assistance: 5: Supervision Stairs Assistance Details (indicate cue type and reason): instructed on how to go up and over a curb with rw Stair Management Technique: Forwards;With walker Number of Stairs: 1  (performed 3 times to ensure comfort and safety with techmiqu)      PT Goals Acute Rehab PT Goals PT Goal: Supine/Side to Sit - Progress: Progressing toward goal PT Goal: Sit to Supine/Side - Progress: Progressing toward goal PT Goal: Sit to Stand - Progress: Progressing toward goal PT Goal: Stand to Sit - Progress: Progressing toward goal PT Goal: Stand - Progress: Progressing toward goal Pt will Ambulate: >150 feet;with modified independence;with rolling walker PT Goal: Ambulate - Progress: Progressing toward goal  Visit Information  Last PT Received On: 07/05/12 Assistance Needed: +1    Subjective Data  Subjective: I am gonna have some friends that are nurses stay with me Patient Stated Goal: to go home   Cognition  Overall Cognitive Status: Appears within functional limits for tasks assessed/performed Arousal/Alertness: Awake/alert Orientation Level: Appears intact for tasks assessed Behavior During Session: [facility] for tasks performed    Balance  Balance Balance Assessed: Yes Dynamic Standing Balance Dynamic Standing - Balance Support: No upper extremity supported;During functional activity Dynamic Standing - Level of Assistance: 6: Modified independent (Device/Increase time) Dynamic Standing - Balance Activities: Reaching for objects  End of Session PT - End of Session Equipment Utilized During Treatment: Gait belt;Back brace Activity Tolerance: Patient limited by pain Patient left: in bed;with call bell/phone within reach Nurse Communication: Mobility status   GP     Fabio Asa 07/05/2012,  10:48 AM Charlotte Crumb, PT DPT  [phone]

## 2012-07-06 NOTE — Progress Notes (Signed)
OT Cancellation Note  Patient Details Name: Madison Hines MRN: [id number] DOB: Mar 16, 1951   Cancelled Treatment:    Reason Eval/Treat Not Completed:  (Pt refused citing just wanting to go home, and back pain).  Pt states, "I already know how to do everything, this ain't my first time"  Boykin Reaper [phone] 07/06/2012, 10:39 AM

## 2012-07-06 NOTE — Discharge Summary (Signed)
Physician Discharge Summary  Patient ID: CATHA ONTKO MRN: [id number] DOB/AGE: [birth year] 64 y.o.  Admit date: 06/29/2012 Discharge date: 07/06/2012  Admission Diagnoses:lumbar stenosis. S/p lumbar fusion  Discharge Diagnoses:same   Discharged Condition:no weakness  Hospital Course: surgery Consults: none  Significant Diagnostic Studies: myelogram  Treatments: l34 fusion Discharge Exam: Blood pressure 132/54, pulse 103, temperature 98.7 F (37.1 C), temperature source Oral, resp. rate 18, height 5\' 5"  (1.651 m), weight 83.462 kg (184 lb), SpO2 96.00%. Ambulating, no weakness  Disposition: home     Medication List     As of 07/06/2012 10:49 AM    ASK your doctor about these medications         amLODipine 5 MG tablet   Commonly known as: NORVASC   Take 5 mg by mouth daily.      aspirin 81 MG chewable tablet   Chew 81 mg by mouth daily.      benazepril 10 MG tablet   Commonly known as: LOTENSIN   Take 10 mg by mouth daily.      escitalopram 10 MG tablet   Commonly known as: LEXAPRO   Take 10 mg by mouth daily.      ibuprofen 800 MG tablet   Commonly known as: ADVIL,MOTRIN   Take 800 mg by mouth every 6 (six) hours as needed. For pain      oxyCODONE-acetaminophen 10-325 MG per tablet   Commonly known as: PERCOCET   Take 1 tablet by mouth every 6 (six) hours as needed. For pain      triamterene-hydrochlorothiazide 37.5-25 MG per capsule   Commonly known as: DYAZIDE   Take 1 capsule by mouth daily.      Vitamin D (Ergocalciferol) 50000 UNITS Caps   Commonly known as: DRISDOL   Take 50,000 Units by mouth every 7 (seven) days. On sunday           Follow-up Information    Follow up with [facility]. [facility]ational [facility])    Contact information:   [phone]         Signed: Karn Cassis 07/06/2012, 10:49 AM

## 2012-07-06 NOTE — Progress Notes (Signed)
Pt stable, minimal c/o pain, discharged orders received.  Education completed, medications reviewed, and IV removed.  Pt will be discharged home by family member with HHPT/OT and a 3N1 BSC and walker.

## 2013-02-20 ENCOUNTER — Encounter: Payer: Self-pay | Admitting: Internal Medicine

## 2013-04-18 ENCOUNTER — Encounter: Payer: Self-pay | Admitting: Internal Medicine

## 2013-05-20 ENCOUNTER — Other Ambulatory Visit: Payer: Self-pay | Admitting: Neurosurgery

## 2013-05-20 DIAGNOSIS — M5412 Radiculopathy, cervical region: Secondary | ICD-10-CM

## 2013-05-28 ENCOUNTER — Ambulatory Visit
Admission: RE | Admit: 2013-05-28 | Discharge: 2013-05-28 | Disposition: A | Discharge status: Home / Self Care | Payer: Medicare Other | Source: Ambulatory Visit | Attending: Neurosurgery | Admitting: Neurosurgery

## 2013-05-28 DIAGNOSIS — M5412 Radiculopathy, cervical region: Secondary | ICD-10-CM

## 2013-07-03 ENCOUNTER — Encounter: Payer: Medicare Other | Admitting: Internal Medicine

## 2013-07-31 ENCOUNTER — Ambulatory Visit (AMBULATORY_SURGERY_CENTER): Payer: Self-pay

## 2013-07-31 VITALS — Ht 65.0 in | Wt 192.0 lb

## 2013-07-31 DIAGNOSIS — Z860100 Personal history of colon polyps, unspecified: Secondary | ICD-10-CM

## 2013-07-31 MED ORDER — MOVIPREP 100 G PO SOLR: 1.0000 | Freq: Once | ORAL | Status: DC

## 2013-08-07 ENCOUNTER — Encounter: Payer: Medicare Other | Admitting: Internal Medicine

## 2013-08-09 ENCOUNTER — Encounter: Payer: Medicare Other | Admitting: Internal Medicine

## 2013-08-14 DIAGNOSIS — M4712 Other spondylosis with myelopathy, cervical region: Secondary | ICD-10-CM | POA: Diagnosis not present

## 2013-09-03 ENCOUNTER — Encounter: Payer: Medicare Other | Admitting: Internal Medicine

## 2013-09-03 ENCOUNTER — Telehealth: Payer: Self-pay | Admitting: Internal Medicine

## 2013-09-03 NOTE — Telephone Encounter (Signed)
Please charge no show 

## 2013-09-09 DIAGNOSIS — S139XXA Sprain of joints and ligaments of unspecified parts of neck, initial encounter: Secondary | ICD-10-CM | POA: Diagnosis not present

## 2013-09-09 DIAGNOSIS — M961 Postlaminectomy syndrome, not elsewhere classified: Secondary | ICD-10-CM | POA: Diagnosis not present

## 2013-09-09 DIAGNOSIS — M542 Cervicalgia: Secondary | ICD-10-CM | POA: Diagnosis not present

## 2013-09-09 DIAGNOSIS — G96198 Other disorders of meninges, not elsewhere classified: Secondary | ICD-10-CM | POA: Diagnosis not present

## 2013-10-11 DIAGNOSIS — M542 Cervicalgia: Secondary | ICD-10-CM | POA: Diagnosis not present

## 2013-10-11 DIAGNOSIS — M25519 Pain in unspecified shoulder: Secondary | ICD-10-CM | POA: Diagnosis not present

## 2013-10-15 ENCOUNTER — Other Ambulatory Visit: Payer: Self-pay | Admitting: Anesthesiology

## 2013-10-15 ENCOUNTER — Ambulatory Visit
Admission: RE | Admit: 2013-10-15 | Discharge: 2013-10-15 | Disposition: A | Discharge status: Home / Self Care | Payer: Medicare PPO | Source: Ambulatory Visit | Attending: Anesthesiology | Admitting: Anesthesiology

## 2013-10-15 DIAGNOSIS — T1490XA Injury, unspecified, initial encounter: Secondary | ICD-10-CM

## 2013-10-15 DIAGNOSIS — R0789 Other chest pain: Secondary | ICD-10-CM | POA: Diagnosis not present

## 2013-10-31 DIAGNOSIS — I10 Essential (primary) hypertension: Secondary | ICD-10-CM | POA: Diagnosis not present

## 2013-11-06 DIAGNOSIS — M25519 Pain in unspecified shoulder: Secondary | ICD-10-CM | POA: Diagnosis not present

## 2013-11-06 DIAGNOSIS — M961 Postlaminectomy syndrome, not elsewhere classified: Secondary | ICD-10-CM | POA: Diagnosis not present

## 2013-11-06 DIAGNOSIS — M47812 Spondylosis without myelopathy or radiculopathy, cervical region: Secondary | ICD-10-CM | POA: Diagnosis not present

## 2014-02-04 DIAGNOSIS — M961 Postlaminectomy syndrome, not elsewhere classified: Secondary | ICD-10-CM | POA: Diagnosis not present

## 2014-02-04 DIAGNOSIS — Z79899 Other long term (current) drug therapy: Secondary | ICD-10-CM | POA: Diagnosis not present

## 2014-02-04 DIAGNOSIS — G894 Chronic pain syndrome: Secondary | ICD-10-CM | POA: Diagnosis not present

## 2014-03-06 DIAGNOSIS — F320 Major depressive disorder, single episode, mild: Secondary | ICD-10-CM | POA: Diagnosis not present

## 2014-03-06 DIAGNOSIS — G894 Chronic pain syndrome: Secondary | ICD-10-CM | POA: Diagnosis not present

## 2014-03-06 DIAGNOSIS — I10 Essential (primary) hypertension: Secondary | ICD-10-CM | POA: Diagnosis not present

## 2014-03-06 DIAGNOSIS — F064 Anxiety disorder due to known physiological condition: Secondary | ICD-10-CM | POA: Diagnosis not present

## 2014-04-01 DIAGNOSIS — G894 Chronic pain syndrome: Secondary | ICD-10-CM | POA: Diagnosis not present

## 2014-04-01 DIAGNOSIS — M961 Postlaminectomy syndrome, not elsewhere classified: Secondary | ICD-10-CM | POA: Diagnosis not present

## 2014-04-01 DIAGNOSIS — M47816 Spondylosis without myelopathy or radiculopathy, lumbar region: Secondary | ICD-10-CM | POA: Diagnosis not present

## 2014-04-01 DIAGNOSIS — Z79891 Long term (current) use of opiate analgesic: Secondary | ICD-10-CM | POA: Diagnosis not present

## 2014-05-20 DIAGNOSIS — Z79891 Long term (current) use of opiate analgesic: Secondary | ICD-10-CM | POA: Diagnosis not present

## 2014-05-20 DIAGNOSIS — M961 Postlaminectomy syndrome, not elsewhere classified: Secondary | ICD-10-CM | POA: Diagnosis not present

## 2014-05-20 DIAGNOSIS — G894 Chronic pain syndrome: Secondary | ICD-10-CM | POA: Diagnosis not present

## 2014-05-20 DIAGNOSIS — M47816 Spondylosis without myelopathy or radiculopathy, lumbar region: Secondary | ICD-10-CM | POA: Diagnosis not present

## 2014-09-18 ENCOUNTER — Other Ambulatory Visit: Payer: Self-pay | Admitting: Neurosurgery

## 2014-09-18 DIAGNOSIS — M545 Low back pain: Secondary | ICD-10-CM

## 2014-09-18 DIAGNOSIS — G8929 Other chronic pain: Secondary | ICD-10-CM

## 2014-09-18 DIAGNOSIS — M542 Cervicalgia: Secondary | ICD-10-CM

## 2014-09-24 ENCOUNTER — Ambulatory Visit
Admission: RE | Admit: 2014-09-24 | Discharge: 2014-09-24 | Disposition: A | Discharge status: Home / Self Care | Payer: Medicare Other | Source: Ambulatory Visit | Attending: Neurosurgery | Admitting: Neurosurgery

## 2014-09-24 DIAGNOSIS — M542 Cervicalgia: Secondary | ICD-10-CM

## 2014-09-24 DIAGNOSIS — M545 Low back pain, unspecified: Secondary | ICD-10-CM

## 2014-09-24 DIAGNOSIS — G8929 Other chronic pain: Secondary | ICD-10-CM

## 2014-09-24 MED ORDER — ONDANSETRON HCL 4 MG/2ML IJ SOLN
4.0000 mg | Freq: Once | INTRAMUSCULAR | Status: AC
  Administered 2014-09-24: 4 mg via INTRAMUSCULAR

## 2014-09-24 MED ORDER — DIAZEPAM 5 MG PO TABS
10.0000 mg | ORAL_TABLET | Freq: Once | ORAL | Status: AC
  Administered 2014-09-24: 10 mg via ORAL

## 2014-09-24 MED ORDER — MEPERIDINE HCL 100 MG/ML IJ SOLN
75.0000 mg | Freq: Once | INTRAMUSCULAR | Status: AC
  Administered 2014-09-24: 75 mg via INTRAMUSCULAR

## 2014-09-24 MED ORDER — IOHEXOL 300 MG/ML  SOLN
10.0000 mL | Freq: Once | INTRAMUSCULAR | Status: AC | PRN
  Administered 2014-09-24: 10 mL via INTRATHECAL

## 2014-09-24 MED ORDER — ONDANSETRON HCL 4 MG/2ML IJ SOLN: 4.0000 mg | Freq: Four times a day (QID) | INTRAMUSCULAR | Status: DC | PRN

## 2014-09-24 NOTE — Discharge Instructions (Signed)
Myelogram Discharge Instructions  1. Go home and rest quietly for the next 24 hours.  It is important to lie flat for the next 24 hours.  Get up only to go to the restroom.  You may lie in the bed or on a couch on your back, your stomach, your left side or your right side.  You may have one pillow under your head.  You may have pillows between your knees while you are on your side or under your knees while you are on your back.  2. DO NOT drive today.  Recline the seat as far back as it will go, while still wearing your seat belt, on the way home.  3. You may get up to go to the bathroom as needed.  You may sit up for 10 minutes to eat.  You may resume your normal diet and medications unless otherwise indicated.  Drink lots of extra fluids today and tomorrow.  4. The incidence of headache, nausea, or vomiting is about 5% (one in 20 patients).  If you develop a headache, lie flat and drink plenty of fluids until the headache goes away.  Caffeinated beverages may be helpful.  If you develop severe nausea and vomiting or a headache that does not go away with flat bed rest, call [phone].  5. You may resume normal activities after your 24 hours of bed rest is over; however, do not exert yourself strongly or do any heavy lifting tomorrow. If when you get up you have a headache when standing, go back to bed and force fluids for another 24 hours.  6. Call your physician for a follow-up appointment.  The results of your myelogram will be sent directly to your physician by the following day.  7. If you have any questions or if complications develop after you arrive home, please call [phone].  Discharge instructions have been explained to the patient.  The patient, or the person responsible for the patient, fully understands these instructions.       May resume Lexapro on September 25, 2014, after 1:00 pm.

## 2014-09-24 NOTE — Progress Notes (Signed)
Pt states she has been off Lexapro for the past 3 days.  Discharge instructions explained to pt.

## 2014-11-04 DIAGNOSIS — G894 Chronic pain syndrome: Secondary | ICD-10-CM | POA: Diagnosis not present

## 2014-11-04 DIAGNOSIS — M47816 Spondylosis without myelopathy or radiculopathy, lumbar region: Secondary | ICD-10-CM | POA: Diagnosis not present

## 2014-11-04 DIAGNOSIS — M961 Postlaminectomy syndrome, not elsewhere classified: Secondary | ICD-10-CM | POA: Diagnosis not present

## 2014-11-04 DIAGNOSIS — Z79891 Long term (current) use of opiate analgesic: Secondary | ICD-10-CM | POA: Diagnosis not present

## 2014-12-18 DIAGNOSIS — M545 Low back pain: Secondary | ICD-10-CM | POA: Diagnosis not present

## 2014-12-18 DIAGNOSIS — Z6832 Body mass index (BMI) 32.0-32.9, adult: Secondary | ICD-10-CM | POA: Diagnosis not present

## 2014-12-18 DIAGNOSIS — M549 Dorsalgia, unspecified: Secondary | ICD-10-CM | POA: Diagnosis not present

## 2014-12-26 DIAGNOSIS — Z6832 Body mass index (BMI) 32.0-32.9, adult: Secondary | ICD-10-CM | POA: Diagnosis not present

## 2014-12-26 DIAGNOSIS — M4806 Spinal stenosis, lumbar region: Secondary | ICD-10-CM | POA: Diagnosis not present

## 2014-12-30 DIAGNOSIS — M961 Postlaminectomy syndrome, not elsewhere classified: Secondary | ICD-10-CM | POA: Diagnosis not present

## 2014-12-30 DIAGNOSIS — G894 Chronic pain syndrome: Secondary | ICD-10-CM | POA: Diagnosis not present

## 2014-12-30 DIAGNOSIS — M4806 Spinal stenosis, lumbar region: Secondary | ICD-10-CM | POA: Diagnosis not present

## 2014-12-30 DIAGNOSIS — Z79891 Long term (current) use of opiate analgesic: Secondary | ICD-10-CM | POA: Diagnosis not present

## 2015-01-06 DIAGNOSIS — M4806 Spinal stenosis, lumbar region: Secondary | ICD-10-CM | POA: Diagnosis not present

## 2015-02-27 DIAGNOSIS — I10 Essential (primary) hypertension: Secondary | ICD-10-CM | POA: Diagnosis not present

## 2015-02-27 DIAGNOSIS — J440 Chronic obstructive pulmonary disease with acute lower respiratory infection: Secondary | ICD-10-CM | POA: Diagnosis not present

## 2015-02-27 DIAGNOSIS — F320 Major depressive disorder, single episode, mild: Secondary | ICD-10-CM | POA: Diagnosis not present

## 2015-03-05 DIAGNOSIS — M4806 Spinal stenosis, lumbar region: Secondary | ICD-10-CM | POA: Diagnosis not present

## 2015-03-05 DIAGNOSIS — M961 Postlaminectomy syndrome, not elsewhere classified: Secondary | ICD-10-CM | POA: Diagnosis not present

## 2015-03-05 DIAGNOSIS — Z79891 Long term (current) use of opiate analgesic: Secondary | ICD-10-CM | POA: Diagnosis not present

## 2015-03-05 DIAGNOSIS — G894 Chronic pain syndrome: Secondary | ICD-10-CM | POA: Diagnosis not present

## 2015-04-27 DIAGNOSIS — G894 Chronic pain syndrome: Secondary | ICD-10-CM | POA: Diagnosis not present

## 2015-04-27 DIAGNOSIS — M4806 Spinal stenosis, lumbar region: Secondary | ICD-10-CM | POA: Diagnosis not present

## 2015-04-27 DIAGNOSIS — Z79891 Long term (current) use of opiate analgesic: Secondary | ICD-10-CM | POA: Diagnosis not present

## 2015-04-27 DIAGNOSIS — M961 Postlaminectomy syndrome, not elsewhere classified: Secondary | ICD-10-CM | POA: Diagnosis not present

## 2015-05-15 DIAGNOSIS — Z79891 Long term (current) use of opiate analgesic: Secondary | ICD-10-CM | POA: Diagnosis not present

## 2015-05-15 DIAGNOSIS — G894 Chronic pain syndrome: Secondary | ICD-10-CM | POA: Diagnosis not present

## 2015-05-15 DIAGNOSIS — M4806 Spinal stenosis, lumbar region: Secondary | ICD-10-CM | POA: Diagnosis not present

## 2015-05-15 DIAGNOSIS — M961 Postlaminectomy syndrome, not elsewhere classified: Secondary | ICD-10-CM | POA: Diagnosis not present

## 2015-05-21 DIAGNOSIS — M4806 Spinal stenosis, lumbar region: Secondary | ICD-10-CM | POA: Diagnosis not present

## 2015-06-29 DIAGNOSIS — I10 Essential (primary) hypertension: Secondary | ICD-10-CM | POA: Diagnosis not present

## 2015-06-29 DIAGNOSIS — J440 Chronic obstructive pulmonary disease with acute lower respiratory infection: Secondary | ICD-10-CM | POA: Diagnosis not present

## 2015-07-09 DIAGNOSIS — G894 Chronic pain syndrome: Secondary | ICD-10-CM | POA: Diagnosis not present

## 2015-07-09 DIAGNOSIS — Z79891 Long term (current) use of opiate analgesic: Secondary | ICD-10-CM | POA: Diagnosis not present

## 2015-07-09 DIAGNOSIS — M961 Postlaminectomy syndrome, not elsewhere classified: Secondary | ICD-10-CM | POA: Diagnosis not present

## 2015-07-09 DIAGNOSIS — M4806 Spinal stenosis, lumbar region: Secondary | ICD-10-CM | POA: Diagnosis not present

## 2015-07-30 DIAGNOSIS — R110 Nausea: Secondary | ICD-10-CM | POA: Diagnosis not present

## 2015-07-30 DIAGNOSIS — I10 Essential (primary) hypertension: Secondary | ICD-10-CM | POA: Diagnosis not present

## 2015-07-31 ENCOUNTER — Other Ambulatory Visit: Payer: Self-pay | Admitting: Neurosurgery

## 2015-07-31 DIAGNOSIS — M48061 Spinal stenosis, lumbar region without neurogenic claudication: Secondary | ICD-10-CM

## 2015-08-20 ENCOUNTER — Other Ambulatory Visit: Payer: Medicare Other

## 2015-08-28 ENCOUNTER — Ambulatory Visit
Admission: RE | Admit: 2015-08-28 | Discharge: 2015-08-28 | Disposition: A | Discharge status: Home / Self Care | Payer: Medicare Other | Source: Ambulatory Visit | Attending: Neurosurgery | Admitting: Neurosurgery

## 2015-08-28 DIAGNOSIS — M4806 Spinal stenosis, lumbar region: Secondary | ICD-10-CM | POA: Diagnosis not present

## 2015-08-28 DIAGNOSIS — M48061 Spinal stenosis, lumbar region without neurogenic claudication: Secondary | ICD-10-CM

## 2015-08-28 MED ORDER — IOPAMIDOL (ISOVUE-M 200) INJECTION 41%
15.0000 mL | Freq: Once | INTRAMUSCULAR | Status: AC
  Administered 2015-08-28: 15 mL via INTRATHECAL

## 2015-08-28 MED ORDER — OXYCODONE-ACETAMINOPHEN 5-325 MG PO TABS
2.0000 | ORAL_TABLET | Freq: Once | ORAL | Status: AC
  Administered 2015-08-28: 2 via ORAL

## 2015-08-28 MED ORDER — DIAZEPAM 5 MG PO TABS
5.0000 mg | ORAL_TABLET | Freq: Once | ORAL | Status: AC
  Administered 2015-08-28: 5 mg via ORAL

## 2015-08-28 NOTE — Progress Notes (Signed)
Patient states she has been off Lexapro for at least the past two days.  Richetta Cubillos, RN 

## 2015-08-28 NOTE — Discharge Instructions (Signed)
Myelogram Discharge Instructions  1. Go home and rest quietly for the next 24 hours.  It is important to lie flat for the next 24 hours.  Get up only to go to the restroom.  You may lie in the bed or on a couch on your back, your stomach, your left side or your right side.  You may have one pillow under your head.  You may have pillows between your knees while you are on your side or under your knees while you are on your back.  2. DO NOT drive today.  Recline the seat as far back as it will go, while still wearing your seat belt, on the way home.  3. You may get up to go to the bathroom as needed.  You may sit up for 10 minutes to eat.  You may resume your normal diet and medications unless otherwise indicated.  Drink lots of extra fluids today and tomorrow.  4. The incidence of headache, nausea, or vomiting is about 5% (one in 20 patients).  If you develop a headache, lie flat and drink plenty of fluids until the headache goes away.  Caffeinated beverages may be helpful.  If you develop severe nausea and vomiting or a headache that does not go away with flat bed rest, call [phone].  5. You may resume normal activities after your 24 hours of bed rest is over; however, do not exert yourself strongly or do any heavy lifting tomorrow. If when you get up you have a headache when standing, go back to bed and force fluids for another 24 hours.  6. Call your physician for a follow-up appointment.  The results of your myelogram will be sent directly to your physician by the following day.  7. If you have any questions or if complications develop after you arrive home, please call [phone].  Discharge instructions have been explained to the patient.  The patient, or the person responsible for the patient, fully understands these instructions.       May resume Lexapro on August 29, 2015, after 11:00 am.

## 2015-09-01 DIAGNOSIS — M4806 Spinal stenosis, lumbar region: Secondary | ICD-10-CM | POA: Diagnosis not present

## 2015-09-02 ENCOUNTER — Other Ambulatory Visit: Payer: Self-pay | Admitting: Neurosurgery

## 2015-09-03 DIAGNOSIS — G894 Chronic pain syndrome: Secondary | ICD-10-CM | POA: Diagnosis not present

## 2015-09-03 DIAGNOSIS — M961 Postlaminectomy syndrome, not elsewhere classified: Secondary | ICD-10-CM | POA: Diagnosis not present

## 2015-09-03 DIAGNOSIS — M4806 Spinal stenosis, lumbar region: Secondary | ICD-10-CM | POA: Diagnosis not present

## 2015-09-03 DIAGNOSIS — Z79891 Long term (current) use of opiate analgesic: Secondary | ICD-10-CM | POA: Diagnosis not present

## 2015-09-17 ENCOUNTER — Encounter (HOSPITAL_COMMUNITY)
Admission: RE | Admit: 2015-09-17 | Discharge: 2015-09-17 | Disposition: A | Discharge status: Home / Self Care | Payer: Medicare Other | Source: Ambulatory Visit | Attending: Neurosurgery | Admitting: Neurosurgery

## 2015-09-17 ENCOUNTER — Other Ambulatory Visit: Payer: Self-pay

## 2015-09-17 ENCOUNTER — Encounter (HOSPITAL_COMMUNITY): Payer: Self-pay

## 2015-09-17 DIAGNOSIS — R9431 Abnormal electrocardiogram [ECG] [EKG]: Secondary | ICD-10-CM | POA: Insufficient documentation

## 2015-09-17 DIAGNOSIS — Z01818 Encounter for other preprocedural examination: Secondary | ICD-10-CM | POA: Insufficient documentation

## 2015-09-17 DIAGNOSIS — M4806 Spinal stenosis, lumbar region: Secondary | ICD-10-CM | POA: Insufficient documentation

## 2015-09-17 DIAGNOSIS — Z01812 Encounter for preprocedural laboratory examination: Secondary | ICD-10-CM | POA: Insufficient documentation

## 2015-09-17 LAB — CBC
HEMATOCRIT: 33.6 % — AB (ref 36.0–46.0)
HEMOGLOBIN: 11.1 g/dL — AB (ref 12.0–15.0)
MCH: 29.4 pg (ref 26.0–34.0)
MCHC: 33 g/dL (ref 30.0–36.0)
MCV: 88.9 fL (ref 78.0–100.0)
Platelets: 209 10*3/uL (ref 150–400)
RBC: 3.78 MIL/uL — ABNORMAL LOW (ref 3.87–5.11)
RDW: 13.3 % (ref 11.5–15.5)
WBC: 6.4 10*3/uL (ref 4.0–10.5)

## 2015-09-17 LAB — NO BLOOD PRODUCTS

## 2015-09-17 LAB — BASIC METABOLIC PANEL
ANION GAP: 11 (ref 5–15)
BUN: 21 mg/dL — ABNORMAL HIGH (ref 6–20)
CO2: 25 mmol/L (ref 22–32)
Calcium: 9.5 mg/dL (ref 8.9–10.3)
Chloride: 106 mmol/L (ref 101–111)
Creatinine, Ser: 0.73 mg/dL (ref 0.44–1.00)
GFR calc Af Amer: >60 mL/min (ref >60)
GFR calc non Af Amer: >60 mL/min (ref >60)
GLUCOSE: 91 mg/dL (ref 65–99)
POTASSIUM: 4 mmol/L (ref 3.5–5.1)
Sodium: 142 mmol/L (ref 135–145)

## 2015-09-17 LAB — SURGICAL PCR SCREEN
MRSA, PCR: NEGATIVE
Staphylococcus aureus: POSITIVE — AB

## 2015-09-17 NOTE — Progress Notes (Signed)
PCP is Dr. Renaye RakersVeita Bland  Denies ever seeing a cardiologist. Denies having a  Card cath, stress test, or echo.

## 2015-09-17 NOTE — Progress Notes (Signed)
Mupirocin Ointment Rx called into [facility] on [address] for positive PCR of Staph. Pt notified and voiced understanding.

## 2015-09-17 NOTE — Pre-Procedure Instructions (Signed)
Madison Hines  09/17/2015      [id number] [place], [place] - [address] AT [facility] [address] [place] [address] Phone: [phone] Fax: [phone]    Your procedure is scheduled on April 21  Report to [facility] Admitting at 600 A.M.  Call this number if you have problems the morning of surgery:  [phone]   Remember:  Do not eat food or drink liquids after midnight.  Take these medicines the morning of surgery with A SIP OF WATER amlodipine (Norvasc), escitalopram (Lexapro), oxycodone (Oxy IR) if needed,  Oxycodone (Percocet) if needed   Stop taking aspirin, Ibuprofen, Advil, Motrin, Aleve, BC's, Goody's, Herbal medications, fish Oil   Do not wear jewelry, make-up or nail polish.  Do not wear lotions, powders, or perfumes.  You may wear deodorant.  Do not shave 48 hours prior to surgery.  Men may shave face and neck.  Do not bring valuables to the hospital.  [facility] is not responsible for any belongings or valuables.  Contacts, dentures or bridgework may not be worn into surgery.  Leave your suitcase in the car.  After surgery it may be brought to your room.  For patients admitted to the hospital, discharge time will be determined by your treatment team.  Patients discharged the day of surgery will not be allowed to drive home.   Special instructions:  [facility] - Preparing for Surgery  Before surgery, you can play an important role.  Because skin is not sterile, your skin needs to be as free of germs as possible.  You can reduce the number of germs on you skin by washing with CHG (chlorahexidine gluconate) soap before surgery.  CHG is an antiseptic cleaner which kills germs and bonds with the skin to continue killing germs even after washing.  Please DO NOT use if you have an allergy to CHG or antibacterial soaps.  If your skin becomes reddened/irritated stop using the CHG and inform your  nurse when you arrive at Short Stay.  Do not shave (including legs and underarms) for at least 48 hours prior to the first CHG shower.  You may shave your face.  Please follow these instructions carefully:   1.  Shower with CHG Soap the night before surgery and the                                morning of Surgery.  2.  If you choose to wash your hair, wash your hair first as usual with your       normal shampoo.  3.  After you shampoo, rinse your hair and body thoroughly to remove the                      Shampoo.  4.  Use CHG as you would any other liquid soap.  You can apply chg directly       to the skin and wash gently with scrungie or a clean washcloth.  5.  Apply the CHG Soap to your body ONLY FROM THE NECK DOWN.        Do not use on open wounds or open sores.  Avoid contact with your eyes,       ears, mouth and genitals (private parts).  Wash genitals (private parts)       with your normal  soap.  6.  Wash thoroughly, paying special attention to the area where your surgery        will be performed.  7.  Thoroughly rinse your body with warm water from the neck down.  8.  DO NOT shower/wash with your normal soap after using and rinsing off       the CHG Soap.  9.  Pat yourself dry with a clean towel.            10.  Wear clean pajamas.            11.  Place clean sheets on your bed the night of your first shower and do not        sleep with pets.  Day of Surgery  Do not apply any lotions/deoderants the morning of surgery.  Please wear clean clothes to the hospital/surgery center.     Please read over the following fact sheets that you were given. Pain Booklet, Coughing and Deep Breathing, Blood Transfusion Information, MRSA Information and Surgical Site Infection Prevention

## 2015-09-24 MED ORDER — CEFAZOLIN SODIUM-DEXTROSE 2-4 GM/100ML-% IV SOLN
2.0000 g | INTRAVENOUS | Status: AC
  Administered 2015-09-25: 2 g via INTRAVENOUS
  Filled 2015-09-24: qty 100
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
## 2015-09-25 ENCOUNTER — Inpatient Hospital Stay (HOSPITAL_COMMUNITY): Payer: Medicare Other | Admitting: Certified Registered Nurse Anesthetist

## 2015-09-25 ENCOUNTER — Encounter (HOSPITAL_COMMUNITY): Payer: Self-pay | Admitting: *Deleted

## 2015-09-25 ENCOUNTER — Inpatient Hospital Stay (HOSPITAL_COMMUNITY): Payer: Medicare Other | Admitting: Vascular Surgery

## 2015-09-25 ENCOUNTER — Inpatient Hospital Stay (HOSPITAL_COMMUNITY)
Admission: RE | Admit: 2015-09-25 | Discharge: 2015-10-01 | DRG: 460 | Disposition: A | Discharge status: Home / Self Care | Payer: Medicare Other | Source: Ambulatory Visit | Attending: Neurosurgery | Admitting: Neurosurgery

## 2015-09-25 ENCOUNTER — Encounter (HOSPITAL_COMMUNITY)
Admission: RE | Disposition: A | Discharge status: Home / Self Care | Payer: Self-pay | Source: Ambulatory Visit | Attending: Neurosurgery

## 2015-09-25 ENCOUNTER — Inpatient Hospital Stay (HOSPITAL_COMMUNITY): Payer: Medicare Other

## 2015-09-25 DIAGNOSIS — Z9841 Cataract extraction status, right eye: Secondary | ICD-10-CM | POA: Diagnosis not present

## 2015-09-25 DIAGNOSIS — K219 Gastro-esophageal reflux disease without esophagitis: Secondary | ICD-10-CM | POA: Diagnosis present

## 2015-09-25 DIAGNOSIS — M4806 Spinal stenosis, lumbar region: Secondary | ICD-10-CM | POA: Diagnosis not present

## 2015-09-25 DIAGNOSIS — M5416 Radiculopathy, lumbar region: Secondary | ICD-10-CM | POA: Diagnosis not present

## 2015-09-25 DIAGNOSIS — Z96643 Presence of artificial hip joint, bilateral: Secondary | ICD-10-CM | POA: Diagnosis not present

## 2015-09-25 DIAGNOSIS — I10 Essential (primary) hypertension: Secondary | ICD-10-CM | POA: Diagnosis not present

## 2015-09-25 DIAGNOSIS — Z96612 Presence of left artificial shoulder joint: Secondary | ICD-10-CM | POA: Diagnosis not present

## 2015-09-25 DIAGNOSIS — Z90710 Acquired absence of both cervix and uterus: Secondary | ICD-10-CM

## 2015-09-25 DIAGNOSIS — G4730 Sleep apnea, unspecified: Secondary | ICD-10-CM | POA: Diagnosis present

## 2015-09-25 DIAGNOSIS — Z6831 Body mass index (BMI) 31.0-31.9, adult: Secondary | ICD-10-CM

## 2015-09-25 DIAGNOSIS — Z961 Presence of intraocular lens: Secondary | ICD-10-CM | POA: Diagnosis not present

## 2015-09-25 DIAGNOSIS — Z9842 Cataract extraction status, left eye: Secondary | ICD-10-CM | POA: Diagnosis not present

## 2015-09-25 DIAGNOSIS — M5116 Intervertebral disc disorders with radiculopathy, lumbar region: Secondary | ICD-10-CM | POA: Diagnosis not present

## 2015-09-25 DIAGNOSIS — Z87891 Personal history of nicotine dependence: Secondary | ICD-10-CM

## 2015-09-25 DIAGNOSIS — M1990 Unspecified osteoarthritis, unspecified site: Secondary | ICD-10-CM | POA: Diagnosis not present

## 2015-09-25 DIAGNOSIS — E669 Obesity, unspecified: Secondary | ICD-10-CM | POA: Diagnosis present

## 2015-09-25 DIAGNOSIS — R269 Unspecified abnormalities of gait and mobility: Secondary | ICD-10-CM | POA: Diagnosis not present

## 2015-09-25 DIAGNOSIS — M48061 Spinal stenosis, lumbar region without neurogenic claudication: Secondary | ICD-10-CM | POA: Diagnosis present

## 2015-09-25 DIAGNOSIS — Z96611 Presence of right artificial shoulder joint: Secondary | ICD-10-CM | POA: Diagnosis present

## 2015-09-25 DIAGNOSIS — Z419 Encounter for procedure for purposes other than remedying health state, unspecified: Secondary | ICD-10-CM

## 2015-09-25 DIAGNOSIS — M4326 Fusion of spine, lumbar region: Secondary | ICD-10-CM | POA: Diagnosis not present

## 2015-09-25 SURGERY — POSTERIOR LUMBAR FUSION 1 LEVEL
Anesthesia: General | Site: Spine Lumbar

## 2015-09-25 MED ORDER — PHENYLEPHRINE 40 MCG/ML (10ML) SYRINGE FOR IV PUSH (FOR BLOOD PRESSURE SUPPORT)
PREFILLED_SYRINGE | INTRAVENOUS | Status: AC
  Filled 2015-09-25: qty 10

## 2015-09-25 MED ORDER — FENTANYL CITRATE (PF) 100 MCG/2ML IJ SOLN
INTRAMUSCULAR | Status: DC | PRN
  Administered 2015-09-25: 100 ug via INTRAVENOUS
  Administered 2015-09-25 (×2): 50 ug via INTRAVENOUS
  Administered 2015-09-25 (×3): 100 ug via INTRAVENOUS
  Administered 2015-09-25 (×5): 50 ug via INTRAVENOUS

## 2015-09-25 MED ORDER — LIDOCAINE HCL (CARDIAC) 20 MG/ML IV SOLN
INTRAVENOUS | Status: AC
  Filled 2015-09-25: qty 5

## 2015-09-25 MED ORDER — OXYCODONE-ACETAMINOPHEN 5-325 MG PO TABS
1.0000 | ORAL_TABLET | ORAL | Status: DC | PRN
  Administered 2015-09-25 – 2015-09-26 (×5): 2 via ORAL
  Filled 2015-09-25 (×5): qty 2

## 2015-09-25 MED ORDER — ONDANSETRON HCL 4 MG/2ML IJ SOLN: 4.0000 mg | Freq: Once | INTRAMUSCULAR | Status: DC | PRN

## 2015-09-25 MED ORDER — PHENYLEPHRINE HCL 10 MG/ML IJ SOLN
INTRAMUSCULAR | Status: AC
  Filled 2015-09-25: qty 1

## 2015-09-25 MED ORDER — ZOLPIDEM TARTRATE 5 MG PO TABS: 5.0000 mg | ORAL_TABLET | Freq: Every evening | ORAL | Status: DC | PRN

## 2015-09-25 MED ORDER — SODIUM CHLORIDE 0.9% FLUSH
3.0000 mL | Freq: Two times a day (BID) | INTRAVENOUS | Status: DC
  Administered 2015-09-25 – 2015-10-01 (×12): 3 mL via INTRAVENOUS

## 2015-09-25 MED ORDER — SUCCINYLCHOLINE CHLORIDE 20 MG/ML IJ SOLN
INTRAMUSCULAR | Status: AC
  Filled 2015-09-25: qty 1

## 2015-09-25 MED ORDER — BUPIVACAINE LIPOSOME 1.3 % IJ SUSP
20.0000 mL | INTRAMUSCULAR | Status: AC
  Filled 2015-09-25: qty 20

## 2015-09-25 MED ORDER — PHENOL 1.4 % MT LIQD: 1.0000 | OROMUCOSAL | Status: DC | PRN

## 2015-09-25 MED ORDER — ONDANSETRON HCL 4 MG/2ML IJ SOLN
INTRAMUSCULAR | Status: DC | PRN
  Administered 2015-09-25: 4 mg via INTRAVENOUS

## 2015-09-25 MED ORDER — OXYCODONE HCL 5 MG PO TABS
ORAL_TABLET | ORAL | Status: AC
  Administered 2015-09-25: 10 mg via ORAL
  Filled 2015-09-25: qty 2

## 2015-09-25 MED ORDER — LIDOCAINE HCL (CARDIAC) 20 MG/ML IV SOLN
INTRAVENOUS | Status: DC | PRN
  Administered 2015-09-25: 100 mg via INTRAVENOUS

## 2015-09-25 MED ORDER — HYDROMORPHONE HCL 1 MG/ML IJ SOLN
0.2500 mg | INTRAMUSCULAR | Status: DC | PRN
  Administered 2015-09-25 (×2): 0.5 mg via INTRAVENOUS

## 2015-09-25 MED ORDER — THROMBIN 5000 UNITS EX SOLR
CUTANEOUS | Status: DC | PRN
  Administered 2015-09-25 (×2): 5000 [IU] via TOPICAL

## 2015-09-25 MED ORDER — PROPOFOL 10 MG/ML IV BOLUS
INTRAVENOUS | Status: AC
  Filled 2015-09-25: qty 20

## 2015-09-25 MED ORDER — HYDROMORPHONE HCL 1 MG/ML IJ SOLN
INTRAMUSCULAR | Status: AC
  Administered 2015-09-25: 0.5 mg via INTRAVENOUS
  Filled 2015-09-25: qty 1

## 2015-09-25 MED ORDER — HEMOSTATIC AGENTS (NO CHARGE) OPTIME
TOPICAL | Status: DC | PRN
  Administered 2015-09-25: 1 via TOPICAL

## 2015-09-25 MED ORDER — PHENYLEPHRINE HCL 10 MG/ML IJ SOLN
INTRAMUSCULAR | Status: DC | PRN
  Administered 2015-09-25 (×2): 40 ug via INTRAVENOUS
  Administered 2015-09-25 (×4): 80 ug via INTRAVENOUS

## 2015-09-25 MED ORDER — ACETAMINOPHEN 650 MG RE SUPP
650.0000 mg | RECTAL | Status: DC | PRN
Start: 2015-09-25 — End: 2015-10-01

## 2015-09-25 MED ORDER — ESCITALOPRAM OXALATE 20 MG PO TABS
20.0000 mg | ORAL_TABLET | Freq: Every day | ORAL | Status: DC
  Administered 2015-09-25 – 2015-10-01 (×7): 20 mg via ORAL
  Filled 2015-09-25 (×2): qty 2
  Filled 2015-09-25: qty 1
  Filled 2015-09-25: qty 2
  Filled 2015-09-25: qty 1
  Filled 2015-09-25: qty 2
  Filled 2015-09-25: qty 1
  Filled 2015-09-25: qty 2
  Filled 2015-09-25: qty 1
  Filled 2015-09-25: qty 2
  Filled 2015-09-25 (×3): qty 1
  Filled 2015-09-25: qty 2

## 2015-09-25 MED ORDER — ONDANSETRON HCL 4 MG/2ML IJ SOLN
INTRAMUSCULAR | Status: AC
  Filled 2015-09-25: qty 2

## 2015-09-25 MED ORDER — VANCOMYCIN HCL 1000 MG IV SOLR
INTRAVENOUS | Status: AC
  Filled 2015-09-25: qty 1000

## 2015-09-25 MED ORDER — METOPROLOL TARTRATE 1 MG/ML IV SOLN
INTRAVENOUS | Status: AC
  Filled 2015-09-25: qty 5

## 2015-09-25 MED ORDER — 0.9 % SODIUM CHLORIDE (POUR BTL) OPTIME
TOPICAL | Status: DC | PRN
  Administered 2015-09-25: 1000 mL

## 2015-09-25 MED ORDER — SODIUM CHLORIDE 0.9 % IV SOLN
250.0000 mL | INTRAVENOUS | Status: DC
  Administered 2015-09-25: 250 mL via INTRAVENOUS

## 2015-09-25 MED ORDER — FENTANYL CITRATE (PF) 250 MCG/5ML IJ SOLN
INTRAMUSCULAR | Status: AC
Start: 2015-09-25 — End: 2015-09-25
  Filled 2015-09-25: qty 5

## 2015-09-25 MED ORDER — METOPROLOL TARTRATE 1 MG/ML IV SOLN
INTRAVENOUS | Status: DC | PRN
  Administered 2015-09-25 (×2): 2 mg via INTRAVENOUS
  Administered 2015-09-25: 1 mg via INTRAVENOUS

## 2015-09-25 MED ORDER — PHENYLEPHRINE HCL 10 MG/ML IJ SOLN
10.0000 mg | INTRAVENOUS | Status: DC | PRN
  Administered 2015-09-25: 20 ug/min via INTRAVENOUS

## 2015-09-25 MED ORDER — THROMBIN 20000 UNITS EX SOLR
CUTANEOUS | Status: DC | PRN
  Administered 2015-09-25: 20 mL via TOPICAL

## 2015-09-25 MED ORDER — ROCURONIUM BROMIDE 50 MG/5ML IV SOLN
INTRAVENOUS | Status: AC
  Filled 2015-09-25: qty 1

## 2015-09-25 MED ORDER — VANCOMYCIN HCL 1000 MG IV SOLR
INTRAVENOUS | Status: DC | PRN
  Administered 2015-09-25: 1000 mg via TOPICAL

## 2015-09-25 MED ORDER — DIAZEPAM 5 MG PO TABS
5.0000 mg | ORAL_TABLET | Freq: Four times a day (QID) | ORAL | Status: DC | PRN
  Administered 2015-09-25 – 2015-09-30 (×11): 5 mg via ORAL
  Filled 2015-09-25 (×11): qty 1

## 2015-09-25 MED ORDER — DEXAMETHASONE SODIUM PHOSPHATE 10 MG/ML IJ SOLN
INTRAMUSCULAR | Status: DC | PRN
  Administered 2015-09-25: 10 mg via INTRAVENOUS

## 2015-09-25 MED ORDER — ESMOLOL HCL 100 MG/10ML IV SOLN
INTRAVENOUS | Status: AC
  Filled 2015-09-25: qty 10

## 2015-09-25 MED ORDER — CEFAZOLIN SODIUM 1-5 GM-% IV SOLN
1.0000 g | Freq: Three times a day (TID) | INTRAVENOUS | Status: AC
  Administered 2015-09-25 – 2015-09-26 (×2): 1 g via INTRAVENOUS
  Filled 2015-09-25 (×2): qty 50

## 2015-09-25 MED ORDER — MENTHOL 3 MG MT LOZG: 1.0000 | LOZENGE | OROMUCOSAL | Status: DC | PRN

## 2015-09-25 MED ORDER — OXYCODONE HCL 5 MG PO TABS
10.0000 mg | ORAL_TABLET | Freq: Once | ORAL | Status: AC | PRN
  Administered 2015-09-25: 10 mg via ORAL

## 2015-09-25 MED ORDER — ROCURONIUM BROMIDE 100 MG/10ML IV SOLN
INTRAVENOUS | Status: DC | PRN
  Administered 2015-09-25: 20 mg via INTRAVENOUS
  Administered 2015-09-25: 30 mg via INTRAVENOUS
  Administered 2015-09-25: 20 mg via INTRAVENOUS
  Administered 2015-09-25: 50 mg via INTRAVENOUS

## 2015-09-25 MED ORDER — BUPIVACAINE LIPOSOME 1.3 % IJ SUSP
INTRAMUSCULAR | Status: DC | PRN
  Administered 2015-09-25: 20 mL

## 2015-09-25 MED ORDER — LACTATED RINGERS IV SOLN
INTRAVENOUS | Status: DC
  Administered 2015-09-25 (×2): via INTRAVENOUS

## 2015-09-25 MED ORDER — FENTANYL CITRATE (PF) 250 MCG/5ML IJ SOLN
INTRAMUSCULAR | Status: AC
  Filled 2015-09-25: qty 5

## 2015-09-25 MED ORDER — SODIUM CHLORIDE 0.9 % IV SOLN
INTRAVENOUS | Status: DC
  Administered 2015-09-25 – 2015-09-26 (×2): via INTRAVENOUS

## 2015-09-25 MED ORDER — SUGAMMADEX SODIUM 200 MG/2ML IV SOLN
INTRAVENOUS | Status: DC | PRN
  Administered 2015-09-25: 200 mg via INTRAVENOUS

## 2015-09-25 MED ORDER — SENNA 8.6 MG PO TABS
1.0000 | ORAL_TABLET | Freq: Two times a day (BID) | ORAL | Status: DC
  Administered 2015-09-25 – 2015-10-01 (×10): 8.6 mg via ORAL
  Filled 2015-09-25 (×11): qty 1

## 2015-09-25 MED ORDER — BACITRACIN ZINC 500 UNIT/GM EX OINT
TOPICAL_OINTMENT | CUTANEOUS | Status: DC | PRN
  Administered 2015-09-25: 1 via TOPICAL

## 2015-09-25 MED ORDER — AMLODIPINE BESYLATE 5 MG PO TABS
5.0000 mg | ORAL_TABLET | Freq: Every day | ORAL | Status: DC
  Administered 2015-09-25 – 2015-09-30 (×6): 5 mg via ORAL
  Filled 2015-09-25 (×7): qty 1

## 2015-09-25 MED ORDER — SUGAMMADEX SODIUM 200 MG/2ML IV SOLN
INTRAVENOUS | Status: AC
  Filled 2015-09-25: qty 2

## 2015-09-25 MED ORDER — DEXAMETHASONE SODIUM PHOSPHATE 10 MG/ML IJ SOLN
INTRAMUSCULAR | Status: AC
  Filled 2015-09-25: qty 1

## 2015-09-25 MED ORDER — BENAZEPRIL HCL 10 MG PO TABS
10.0000 mg | ORAL_TABLET | Freq: Every day | ORAL | Status: DC
  Administered 2015-09-25 – 2015-10-01 (×7): 10 mg via ORAL
  Filled 2015-09-25 (×7): qty 1

## 2015-09-25 MED ORDER — ONDANSETRON HCL 4 MG/2ML IJ SOLN: 4.0000 mg | INTRAMUSCULAR | Status: DC | PRN

## 2015-09-25 MED ORDER — OXYCODONE HCL 5 MG/5ML PO SOLN
5.0000 mg | Freq: Once | ORAL | Status: AC | PRN
Start: 2015-09-25 — End: 2015-09-25

## 2015-09-25 MED ORDER — TRIAMTERENE-HCTZ 37.5-25 MG PO CAPS
1.0000 | ORAL_CAPSULE | Freq: Every day | ORAL | Status: DC
  Administered 2015-09-25 – 2015-10-01 (×7): 1 via ORAL
  Filled 2015-09-25 (×7): qty 1

## 2015-09-25 MED ORDER — PROPOFOL 10 MG/ML IV BOLUS
INTRAVENOUS | Status: DC | PRN
  Administered 2015-09-25: 150 mg via INTRAVENOUS

## 2015-09-25 MED ORDER — SODIUM CHLORIDE 0.9% FLUSH: 3.0000 mL | INTRAVENOUS | Status: DC | PRN

## 2015-09-25 MED ORDER — ACETAMINOPHEN 325 MG PO TABS
650.0000 mg | ORAL_TABLET | ORAL | Status: DC | PRN
Start: 2015-09-25 — End: 2015-10-01
  Administered 2015-09-26: 650 mg via ORAL
  Filled 2015-09-25: qty 2

## 2015-09-25 SURGICAL SUPPLY — 69 items
BENZOIN TINCTURE PRP APPL 2/3 (GAUZE/BANDAGES/DRESSINGS) ×3 IMPLANT
BLADE CLIPPER SURG (BLADE) IMPLANT
BUR ACORN 6.0 (BURR) ×4 IMPLANT
BUR ACORN 6.0MM (BURR) ×2
BUR MATCHSTICK NEURO 3.0 LAGG (BURR) ×3 IMPLANT
CANISTER SUCT 3000ML PPV (MISCELLANEOUS) ×3 IMPLANT
CAP REVERE LOCKING (Cap) ×6 IMPLANT
CLAMP SINGLE ROD TO ROD (Clamp) ×3 IMPLANT
CLOSURE WOUND 1/2 X4 (GAUZE/BANDAGES/DRESSINGS) ×1
CONT SPEC 4OZ CLIKSEAL STRL BL (MISCELLANEOUS) ×3 IMPLANT
COVER BACK TABLE 60X90IN (DRAPES) ×3 IMPLANT
DRAPE C-ARM 42X72 X-RAY (DRAPES) ×6 IMPLANT
DRAPE LAPAROTOMY 100X72X124 (DRAPES) ×3 IMPLANT
DRAPE POUCH INSTRU U-SHP 10X18 (DRAPES) ×3 IMPLANT
DRAPE PROXIMA HALF (DRAPES) ×3 IMPLANT
DRSG PAD ABDOMINAL 8X10 ST (GAUZE/BANDAGES/DRESSINGS) ×3 IMPLANT
DURAPREP 26ML APPLICATOR (WOUND CARE) ×3 IMPLANT
ELECT BLADE 4.0 EZ CLEAN MEGAD (MISCELLANEOUS) ×3
ELECT REM PT RETURN 9FT ADLT (ELECTROSURGICAL) ×3
ELECTRODE BLDE 4.0 EZ CLN MEGD (MISCELLANEOUS) ×1 IMPLANT
ELECTRODE REM PT RTRN 9FT ADLT (ELECTROSURGICAL) ×1 IMPLANT
EVACUATOR 1/8 PVC DRAIN (DRAIN) IMPLANT
GAUZE SPONGE 4X4 12PLY STRL (GAUZE/BANDAGES/DRESSINGS) ×3 IMPLANT
GAUZE SPONGE 4X4 16PLY XRAY LF (GAUZE/BANDAGES/DRESSINGS) IMPLANT
GLOVE BIOGEL M 8.0 STRL (GLOVE) ×6 IMPLANT
GLOVE BIOGEL PI IND STRL 7.0 (GLOVE) ×4 IMPLANT
GLOVE BIOGEL PI IND STRL 8 (GLOVE) ×2 IMPLANT
GLOVE BIOGEL PI INDICATOR 7.0 (GLOVE) ×8
GLOVE BIOGEL PI INDICATOR 8 (GLOVE) ×4
GLOVE EXAM NITRILE LRG STRL (GLOVE) IMPLANT
GLOVE EXAM NITRILE MD LF STRL (GLOVE) IMPLANT
GLOVE EXAM NITRILE XL STR (GLOVE) IMPLANT
GLOVE EXAM NITRILE XS STR PU (GLOVE) IMPLANT
GLOVE SURG SS PI 6.5 STRL IVOR (GLOVE) ×12 IMPLANT
GOWN STRL REUS W/ TWL LRG LVL3 (GOWN DISPOSABLE) ×3 IMPLANT
GOWN STRL REUS W/ TWL XL LVL3 (GOWN DISPOSABLE) ×2 IMPLANT
GOWN STRL REUS W/TWL 2XL LVL3 (GOWN DISPOSABLE) ×3 IMPLANT
GOWN STRL REUS W/TWL LRG LVL3 (GOWN DISPOSABLE) ×6
GOWN STRL REUS W/TWL XL LVL3 (GOWN DISPOSABLE) ×4
KIT BASIN OR (CUSTOM PROCEDURE TRAY) ×3 IMPLANT
KIT INFUSE SMALL (Orthopedic Implant) ×3 IMPLANT
KIT ROOM TURNOVER OR (KITS) ×3 IMPLANT
NEEDLE HYPO 18GX1.5 BLUNT FILL (NEEDLE) IMPLANT
NEEDLE HYPO 21X1.5 SAFETY (NEEDLE) ×3 IMPLANT
NEEDLE HYPO 25X1 1.5 SAFETY (NEEDLE) ×3 IMPLANT
NS IRRIG 1000ML POUR BTL (IV SOLUTION) ×3 IMPLANT
PACK LAMINECTOMY NEURO (CUSTOM PROCEDURE TRAY) ×3 IMPLANT
PAD ARMBOARD 7.5X6 YLW CONV (MISCELLANEOUS) ×21 IMPLANT
PATTIES SURGICAL .5 X1 (DISPOSABLE) IMPLANT
PATTIES SURGICAL .5 X3 (DISPOSABLE) IMPLANT
ROD STRAIGHT REVERE 6.35 65MM (Rod) ×6 IMPLANT
SCREW REVERE 6.35 5.5X40MM (Screw) ×6 IMPLANT
SPONGE LAP 4X18 X RAY DECT (DISPOSABLE) IMPLANT
SPONGE NEURO XRAY DETECT 1X3 (DISPOSABLE) IMPLANT
SPONGE SURGIFOAM ABS GEL 100 (HEMOSTASIS) ×3 IMPLANT
SPONGE SURGIFOAM ABS GEL SZ50 (HEMOSTASIS) ×3 IMPLANT
STAPLER VISISTAT 35W (STAPLE) ×3 IMPLANT
STRIP CLOSURE SKIN 1/2X4 (GAUZE/BANDAGES/DRESSINGS) ×2 IMPLANT
SUT VIC AB 1 CT1 18XBRD ANBCTR (SUTURE) ×1 IMPLANT
SUT VIC AB 1 CT1 8-18 (SUTURE) ×3
SUT VIC AB 2-0 CP2 18 (SUTURE) ×3 IMPLANT
SUT VIC AB 3-0 SH 8-18 (SUTURE) ×3 IMPLANT
SYR 5ML LL (SYRINGE) IMPLANT
TAPE CLOTH SURG 4X10 WHT LF (GAUZE/BANDAGES/DRESSINGS) ×3 IMPLANT
TOWEL OR 17X24 6PK STRL BLUE (TOWEL DISPOSABLE) ×3 IMPLANT
TOWEL OR 17X26 10 PK STRL BLUE (TOWEL DISPOSABLE) ×3 IMPLANT
TRAY FOLEY W/METER SILVER 14FR (SET/KITS/TRAYS/PACK) IMPLANT
TRAY FOLEY W/METER SILVER 16FR (SET/KITS/TRAYS/PACK) ×3 IMPLANT
WATER STERILE IRR 1000ML POUR (IV SOLUTION) ×3 IMPLANT

## 2015-09-25 NOTE — Anesthesia Preprocedure Evaluation (Addendum)
Anesthesia Evaluation  Patient identified by MRN, date of birth, ID band Patient awake    Reviewed: Allergy & Precautions, H&P , NPO status , Patient's Chart, lab work & pertinent test results  Airway Mallampati: II  TM Distance: >3 FB Neck ROM: Full    Dental  (+) Edentulous Upper, Edentulous Lower   Pulmonary sleep apnea , former smoker,    breath sounds clear to auscultation       Cardiovascular hypertension, Pt. on medications  Rhythm:Regular Rate:Normal     Neuro/Psych Anxiety    GI/Hepatic hiatal hernia, GERD  ,  Endo/Other    Renal/GU      Musculoskeletal  (+) Arthritis ,   Abdominal (+) + obese,   Peds  Hematology  (+) REFUSES BLOOD PRODUCTS,   Anesthesia Other Findings Can do albumin and cell saver per patient  Reproductive/Obstetrics                            Anesthesia Physical  Anesthesia Plan  ASA: III  Anesthesia Plan: General   Post-op Pain Management:    Induction: Intravenous  Airway Management Planned: Oral ETT  Additional Equipment:   Intra-op Plan:   Post-operative Plan: Extubation in OR  Informed Consent: I have reviewed the patients History and Physical, chart, labs and discussed the procedure including the risks, benefits and alternatives for the proposed anesthesia with the patient or authorized representative who has indicated his/her understanding and acceptance.     Plan Discussed with: CRNA and Surgeon  Anesthesia Plan Comments:         Anesthesia Quick Evaluation

## 2015-09-25 NOTE — Progress Notes (Signed)
Orthopedic Tech Progress Note Patient Details:  Madison Hines Apr 17, 1951 [id number]  Patient ID: Madison Hines, female   DOB: Apr 17, 1951, 65 y.o.   MRN: [id number] Called in bio-tech brace order; spoke with Richardean Chimeraathy  [name] 09/25/2015, 2:47 PM

## 2015-09-25 NOTE — Anesthesia Procedure Notes (Signed)
Procedure Name: Intubation Date/Time: 09/25/2015 9:57 AM Performed by: Faustino CongressWHITE, Symia Herdt TENA Sherill Wegener Pre-anesthesia Checklist: Patient identified, Emergency Drugs available, Suction available and Patient being monitored Patient Re-evaluated:Patient Re-evaluated prior to inductionOxygen Delivery Method: Circle System Utilized Preoxygenation: Pre-oxygenation with 100% oxygen Intubation Type: IV induction Ventilation: Mask ventilation without difficulty Laryngoscope Size: Mac and 3 Grade View: Grade I Tube type: Oral Tube size: 7.0 mm Number of attempts: 1 Airway Equipment and Method: Stylet Placement Confirmation: ETT inserted through vocal cords under direct vision,  positive ETCO2 and breath sounds checked- equal and bilateral Secured at: 22 cm Tube secured with: Tape Dental Injury: Teeth and Oropharynx as per pre-operative assessment

## 2015-09-25 NOTE — Anesthesia Postprocedure Evaluation (Signed)
Anesthesia Post Note  Patient: Madison Hines  Procedure(s) Performed: Procedure(s) (LRB): LUMBAR TWO-THREE DECOMPRESSION AND FUSION (N/A)  Patient location during evaluation: PACU Anesthesia Type: General Level of consciousness: awake and alert Pain management: pain level controlled Vital Signs Assessment: post-procedure vital signs reviewed and stable Respiratory status: spontaneous breathing, nonlabored ventilation, respiratory function stable and patient connected to nasal cannula oxygen Cardiovascular status: blood pressure returned to baseline and stable Postop Assessment: no signs of nausea or vomiting Anesthetic complications: no    Last Vitals:  Filed Vitals:   09/25/15 1430 09/25/15 1458  BP: 147/82 140/57  Pulse: 80 88  Temp:  37.3 C  Resp: 16 20    Last Pain:  Filed Vitals:   09/25/15 1500  PainSc: 10-Worst pain ever                 Reino KentJudd, Eliam Snapp J

## 2015-09-25 NOTE — Progress Notes (Signed)
Orthopedic Tech Progress Note Patient Details:  Sherley Boundshyllis B Hoeger Feb 03, 1951 [id number] Brace order completed by bio-tech vendor. Patient ID: Sherley Boundshyllis B Learn, female   DOB: Feb 03, 1951, 65 y.o.   MRN: [id number]   Jennye MoccasinHughes, Archita Lomeli Craig 09/25/2015, 4:48 PM

## 2015-09-25 NOTE — Transfer of Care (Signed)
Immediate Anesthesia Transfer of Care Note  Patient: Madison Hines  Procedure(s) Performed: Procedure(s): LUMBAR TWO-THREE DECOMPRESSION AND FUSION (N/A)  Patient Location: PACU  Anesthesia Type:General  Level of Consciousness: awake, alert  and patient cooperative  Airway & Oxygen Therapy: Patient Spontanous Breathing and Patient connected to nasal cannula oxygen  Post-op Assessment: Report given to RN and Post -op Vital signs reviewed and stable  Post vital signs: Reviewed and stable  Last Vitals:  Filed Vitals:   09/25/15 0741  BP: 147/77  Pulse: 87  Temp: 36.8 C  Resp: 20    Complications: No apparent anesthesia complications

## 2015-09-25 NOTE — Op Note (Signed)
Madison Hines, DUKES NO.:  [id number]  MEDICAL RECORD NO.:  [id number]  LOCATION:  [facility]                         FACILITY:  [facility]  PHYSICIAN:  Hilda Lias, M.D.   DATE OF BIRTH:  [birth year]  DATE OF PROCEDURE:  09/25/2015 DATE OF DISCHARGE:                              OPERATIVE REPORT   PREOPERATIVE DIAGNOSES:  L2-L3 stenosis with radiculopathy. Degenerative disk disease.  Status post fusion L3-4, L4-5 and L5-S1.  POSTOPERATIVE DIAGNOSES:  L2-L3 stenosis with radiculopathy. Degenerative disk disease. Status post fusion L3-4, L4-5 and L5-S1.  PROCEDURE:  Bilateral L2 laminectomy, lysis of adhesion, bilateral facetectomies.  Decompression of the thecal sac as well at the L2 and L3 nerve root.  Augmentation of the fusion with insertion of a new pedicle screw at the level L2 and advancing of the fusion with lateral hooks from L2-L3 down to L2.  Posterolateral arthrodesis with autograft and BMP.  Cell Saver.  C-arm.  SURGEON:  Hilda Lias, M.D.  ASSISTANT:  Dr. Lovell Sheehan.  CLINICAL HISTORY:  Madison Hines is a lady who many years ago underwent fusion.  The patient did well but for the past 18 months, she had been complaining of back pain associated with weakness proximally, difficulty walking up and down steps.  We knew by x-ray that she has stenosis at level 2-3 with degenerative disk disease.  We talked about surgery.  We talked about decompression and fusion.  The patient decided to go ahead with surgery because the mobility was getting worse.  The patient was aware of the risks and benefits.  DESCRIPTION OF PROCEDURE:  The patient was taken to the OR, and after intubation, she was positioned on prone manner.  The back was cleaned with Betadine and DuraPrep.  Drapes were applied.  We were able to feel the L2 spinous process and an incision L1-L2 was made all the way down to the upper part of the previous surgery.  Decompression was done bilaterally and  we were able to see the pedicles of L3.  Then, we identified the L2 spinous process as well as the lamina.  We proceeded with removal of the spinous process of the lamina bilaterally.  The patient had quite a bit of stenosis between 2-3 with a lot of scar tissue.  Decompression was done, and we were able to go all the way down to the middle part of L3.  From then on, we did a bilateral facetectomy. We tried to get into the disk space, it was quite narrow and we decided not to put any cages.  Then, using the C-arm first in AP view and then in lateral view, we made holes in the pedicles.  Prior to insertion of the pedicle screws, we feel all 4 quadrants just to be sure that we were surrounded by bone and two screws of 5.5 x 40 were inserted.  Then, we introduced two medial and lateral hooks, and we were able to tailor a rod to be able to go from the hook of L3 all the way up to the new pedicle of L2.  The rod was secured with capps.  We went laterally.  We removed the periosteum of the  transverse process of 2-3 as well as the medial facet.  Then, a mix of BMP and autograft were used for arthrodesis. The area was irrigated.  We went back into the canal just to be sure that there was plenty of bone between the screw and the medial wall of the pedicle.  From then on, the area was irrigated, vancomycin powder was left, and the wound was closed with several layers of Vicryl and staples.  Hemovac was left in the operative side.          ______________________________ Hilda LiasErnesto Dink Creps, M.D.     EB/MEDQ  D:  09/25/2015  T:  09/25/2015  Job:  [id number]

## 2015-09-26 MED ORDER — KETOROLAC TROMETHAMINE 15 MG/ML IJ SOLN
15.0000 mg | Freq: Four times a day (QID) | INTRAMUSCULAR | Status: AC
  Administered 2015-09-26 – 2015-09-27 (×5): 15 mg via INTRAVENOUS
  Filled 2015-09-26 (×5): qty 1

## 2015-09-26 MED ORDER — OXYCODONE-ACETAMINOPHEN 5-325 MG PO TABS
1.0000 | ORAL_TABLET | ORAL | Status: DC | PRN
  Administered 2015-09-26 – 2015-09-27 (×7): 2 via ORAL
  Filled 2015-09-26 (×7): qty 2

## 2015-09-26 MED ORDER — DEXAMETHASONE SODIUM PHOSPHATE 4 MG/ML IJ SOLN
8.0000 mg | Freq: Once | INTRAMUSCULAR | Status: AC
  Administered 2015-09-26: 8 mg via INTRAVENOUS
  Filled 2015-09-26: qty 2

## 2015-09-26 NOTE — Care Management Note (Addendum)
Case Management Note  Patient Details  Name: Sherley Boundshyllis B Schuff MRN: [id number] Date of Birth: September 21, 1950  Subjective/Objective: 65 yo F s/p lumbar two-three decompression and fusion.                  [facility]Plan[facility]   Expected Discharge Date:   09/27/15               Expected Discharge Plan:     In-House Referral:     Discharge planning Services  CM Consult  Post Acute Care Choice:    Choice offered to:     DME Arranged:    DME Agency:     HH Arranged:    HH Agency:     Status of Service:  In process, will continue to follow  Medicare Important Message Given:    Date Medicare IM Given:    Medicare IM give by:    Date Additional Medicare IM Given:    Additional Medicare Important Message give by:     If discussed at Long Length of Stay Meetings, dates discussed:    Additional Comments: pt unable to participate with PT due to pain. [facility] .  Isaias Cowmanliveras-Aizpurua, Weslyn Holsonback, RN 09/26/2015, 4:00 PM

## 2015-09-26 NOTE — Progress Notes (Signed)
PT Cancellation Note  Patient Details Name: Madison Hines MRN: [id number] DOB: May 02, 1951   Cancelled Treatment:    Reason Eval/Treat Not Completed: Pain limiting ability to participate. Pt reports that her pain is too great to participate with therapy at this time. Pt was educated on the benefits of OOB and max encouragement was provided, however pt continued to decline until her pain medications are adjusted. Will check back as schedule allows to complete PT eval.    Conni SlipperKirkman, Jashley Yellin 09/26/2015, 1:20 PM   Conni SlipperLaura Jessie Schrieber, PT, DPT Acute Rehabilitation Services Pager: [phone]

## 2015-09-26 NOTE — Progress Notes (Signed)
Occupational Therapy Evaluation Patient Details Name: Madison Hines MRN: [id number] DOB: [birth year] Today's Date: 09/26/2015    History of Present Illness 65 yo s/p L2-3 decompression and fusion   Clinical Impression   PTA, pt independent with ADL and mobility and lived alone. Pt plans to D/C home to live with her daughter while she is recovering. Pt currently requires min A with mobility at RW level nad mod A with LB ADL. Pt only ambulated around room then returned to bed. Pt stated she was too painful to sit up today but would try tomorrow. Back precaution handout reviewed. Will follow pt acutely toaddress established goals and facilitate safe return home with her daughter. Recommend pt follow up with HHOT to reutrn to PLOF with ADL and IADL tasks.    Follow Up Recommendations  Home health OT    Equipment Recommendations  3 in 1 bedside comode;Other (comment) (RW)    Recommendations for Other Services       Precautions / Restrictions Precautions Precautions: Back Precaution Booklet Issued: Yes (comment) Required Braces or Orthoses: Spinal Brace Spinal Brace: Lumbar corset;Applied in sitting position      Mobility Bed Mobility Overal bed mobility: Needs Assistance Bed Mobility: Supine to Sit;Sit to Supine     Supine to sit: Min assist Sit to supine: Min assist      Transfers Overall transfer level: Needs assistance Equipment used: Rolling walker (2 wheeled) Transfers: Sit to/from Stand Sit to Stand: From elevated surface;Min guard         General transfer comment: pt slow but only minguard needed    Balance Overall balance assessment: Needs assistance Sitting-balance support: Feet supported Sitting balance-Leahy Scale: Good       Standing balance-Leahy Scale: Fair                              ADL Overall ADL's : Needs assistance/impaired     Grooming: Set up;Supervision/safety;Sitting   Upper Body Bathing: Set up;Supervision/  safety;Sitting   Lower Body Bathing: Moderate assistance;Sit to/from stand   Upper Body Dressing : Moderate assistance;Sitting (to donn brace)   Lower Body Dressing: Moderate assistance;Sit to/from stand   Toilet Transfer: Minimal assistance;RW;BSC   Toileting- ArchitectClothing Manipulation and Hygiene: Moderate assistance       Functional mobility during ADLs: Minimal assistance;Cueing for safety;Rolling walker General ADL Comments: Began education on back precautions and technique for donning/doffing brace. Sister present     Vision     Perception     Praxis      Pertinent Vitals/Pain Pain Assessment: 0-10 Pain Score: 8  Pain Location: back Pain Descriptors / Indicators: Aching;Burning Pain Intervention(s): Limited activity within patient's tolerance;Repositioned;Ice applied     Hand Dominance     Extremity/Trunk Assessment Upper Extremity Assessment Upper Extremity Assessment: Overall WFL for tasks assessed   Lower Extremity Assessment Lower Extremity Assessment: Defer to PT evaluation   Cervical / Trunk Assessment Cervical / Trunk Assessment: Other exceptions (s/p fusion)   Communication Communication Communication: No difficulties   Cognition Arousal/Alertness: Awake/alert Behavior During Therapy: WFL for tasks assessed/performed Overall Cognitive Status: Within Functional Limits for tasks assessed                     General Comments       Exercises       Shoulder Instructions      Home Living Family/patient expects to be discharged to:: Private residence  Available Help at Discharge: Family;Available 24 hours/day Type of Home: House Home Access: Stairs to enter [employer] of Steps: 2 Entrance Stairs-Rails: None Home Layout: One level     Bathroom Shower/Tub: Chief Strategy Officer: Standard Bathroom Accessibility: Yes How Accessible: Accessible via walker Home Equipment: None          Prior  Functioning/Environment Level of Independence: Independent             OT Diagnosis: Generalized weakness;Acute pain   OT Problem List: Decreased strength;Decreased range of motion;Decreased activity tolerance;Impaired balance (sitting and/or standing);Decreased safety awareness;Decreased knowledge of use of DME or AE;Decreased knowledge of precautions;Pain   OT Treatment/Interventions: Self-care/ADL training;DME and/or AE instruction;Therapeutic activities;Patient/family education;Balance training    OT Goals(Current goals can be found in the care plan section) Acute Rehab OT Goals Patient Stated Goal: to return to being independent OT Goal Formulation: With patient Time For Goal Achievement: 10/10/15 Potential to Achieve Goals: Good ADL Goals Pt Will Perform Lower Body Bathing: with set-up;with supervision;with adaptive equipment;sit to/from stand;with caregiver independent in assisting Pt Will Perform Lower Body Dressing: with set-up;with supervision;with adaptive equipment;with caregiver independent in assisting;sit to/from stand Pt Will Transfer to Toilet: with supervision;bedside commode;ambulating Pt Will Perform Toileting - Clothing Manipulation and hygiene: with supervision;with adaptive equipment;sit to/from stand (verbalize understadning of use of AE to adhere to back preca) Pt Will Perform Tub/Shower Transfer: Tub transfer;3 in 1;with min assist;with caregiver independent in assisting;rolling walker;ambulating Additional ADL Goal #1: Pt will verbalize 3/3 back precautions independently  OT Frequency: Min 3X/week   Barriers to D/C:            Co-evaluation              End of Session Equipment Utilized During Treatment: Gait belt;Rolling walker;Back brace Nurse Communication: Mobility status;Precautions  Activity Tolerance: Patient tolerated treatment well Patient left: in bed;with call bell/phone within reach;with family/visitor present   Time: 5284-1324 OT  Time Calculation (min): 26 min Charges:  OT General Charges $OT Visit: 1 Procedure OT Evaluation $OT Eval Moderate Complexity: 1 Procedure OT Treatments $Self Care/Home Management : 8-22 mins G-Codes:    Jaja Switalski,HILLARY 2015-10-23, 4:11 PM   [facility], OTR/L  [phone] Oct 23, 2015

## 2015-09-26 NOTE — Progress Notes (Signed)
Patient ID: Madison Hines, female   DOB: 06/17/1950, 65 y.o.   MRN: [id number] Patient having moderately severe pain.  Motor function good  vss  drain moderate  will add adjust meds w decadron toradol for better pain control.

## 2015-09-27 MED ORDER — OXYCODONE HCL 5 MG PO TABS
15.0000 mg | ORAL_TABLET | ORAL | Status: DC | PRN
  Administered 2015-09-27 – 2015-10-01 (×17): 15 mg via ORAL
  Filled 2015-09-27 (×18): qty 3

## 2015-09-27 MED ORDER — MORPHINE SULFATE (PF) 2 MG/ML IV SOLN
2.0000 mg | INTRAVENOUS | Status: DC | PRN
  Administered 2015-09-27: 4 mg via INTRAVENOUS
  Filled 2015-09-27: qty 2

## 2015-09-27 MED ORDER — HYDROMORPHONE HCL 1 MG/ML IJ SOLN
1.0000 mg | INTRAMUSCULAR | Status: DC | PRN
Start: 2015-09-27 — End: 2015-10-01
  Administered 2015-09-27 – 2015-09-30 (×8): 1 mg via INTRAVENOUS
  Filled 2015-09-27 (×10): qty 1

## 2015-09-27 NOTE — Progress Notes (Signed)
Patient ID: Madison Hines, female   DOB: 01-19-1951, 65 y.o.   MRN: [id number] C/o incisional pain. No weakness drain out

## 2015-09-27 NOTE — Progress Notes (Signed)
Occupational Therapy Treatment Patient Details Name: Madison Hines MRN: [id number] DOB: 1951-01-09 Today's Date: 09/27/2015    History of present illness 65 yo s/p L2-3 decompression and fusion. PMH: bilateral partial shoulder replacements, bilateral THA, Back surgery, hypertension.   OT comments  Pt making gradual progress toward OT goals this session. Educated pt on use of AE for increased independence with LB ADLs; pt able to return demo use of reacher and sock aide. Pt currently requires min guard assist for safety with toilet transfers, toilet hygiene, and grooming activities in standing. Pt able to recall 0/3 precautions at start of session; reviewed all precautions with pt. Pt able to don back brace with setup and supervision. D/c plan remains appropriate at this time. Will continue to follow acutely.   Follow Up Recommendations  Home health OT    Equipment Recommendations  3 in 1 bedside comode;Other (comment) (RW)    Recommendations for Other Services      Precautions / Restrictions Precautions Precautions: Back Precaution Comments: Pt able to recall 0/3 precautions at start of session. Reviewed all precautions with pt. Required Braces or Orthoses: Spinal Brace Spinal Brace: Lumbar corset;Applied in sitting position Restrictions Weight Bearing Restrictions: No       Mobility Bed Mobility Overal bed mobility: Needs Assistance Bed Mobility: Rolling;Sidelying to Sit Rolling: Min guard Sidelying to sit: Min guard       General bed mobility comments: Min guard for safety. VCs for hand placement and technique. HOB slightly elevated with use of bed rail.  Transfers Overall transfer level: Needs assistance Equipment used: Rolling walker (2 wheeled) Transfers: Sit to/from Stand Sit to Stand: Min guard         General transfer comment: Min guard for safety. Increased time required. VCs for hand placement.    Balance Overall balance assessment: Needs  assistance Sitting-balance support: Feet supported;No upper extremity supported Sitting balance-Leahy Scale: Good     Standing balance support: No upper extremity supported;During functional activity Standing balance-Leahy Scale: Fair Standing balance comment: Pt able to stand at sink and complete grooming activities without UE support                   ADL Overall ADL's : Needs assistance/impaired     Grooming: Min guard;Wash/dry hands;Standing         Lower Body Bathing Details (indicate cue type and reason): Educated pt on use of long handled sponge Upper Body Dressing : Set up;Supervision/safety;Sitting Upper Body Dressing Details (indicate cue type and reason): to don brace Lower Body Dressing: Min guard;Sit to/from stand;With adaptive equipment;Adhering to back precautions Lower Body Dressing Details (indicate cue type and reason): Educated pt on use of reacher, sock aide, and long handled shoe horn; pt able to return demo use of reacher and sock aide. Toilet Transfer: Min guard;Ambulation;BSC;RW (BSC over toilet)   Toileting- Clothing Manipulation and Hygiene: Min guard;Sit to/from stand Toileting - Clothing Manipulation Details (indicate cue type and reason): for toilet hygiene     Functional mobility during ADLs: Min guard;Rolling walker General ADL Comments: Educated pt on use of toilet aide to maintain precautions durin peri care; pt states she believes she can reach without twisting but verbalized understanding of use of toilet aide if needed upon return home.      Vision                     Ambulance person  Behavior During Therapy: WFL for tasks assessed/performed Overall Cognitive Status: Within Functional Limits for tasks assessed       Memory: Decreased recall of precautions               Extremity/Trunk Assessment               Exercises     Shoulder Instructions       General Comments       Pertinent Vitals/ Pain       Pain Assessment: 0-10 Pain Score: 8  Pain Location: back, incision Pain Descriptors / Indicators: Aching;Grimacing;Operative site guarding Pain Intervention(s): Limited activity within patient's tolerance;Monitored during session;Premedicated before session;Repositioned  Home Living                                          Prior Functioning/Environment              Frequency Min 3X/week     Progress Toward Goals  OT Goals(current goals can now be found in the care plan section)  Progress towards OT goals: Progressing toward goals  Acute Rehab OT Goals Patient Stated Goal: move without pain again.   Plan Discharge plan remains appropriate    Co-evaluation                 End of Session Equipment Utilized During Treatment: Rolling walker;Back brace   Activity Tolerance Patient tolerated treatment well   Patient Left in chair;with call bell/phone within reach   Nurse Communication          Time: 1133-1200 OT Time Calculation (min): 27 min  Charges: OT General Charges $OT Visit: 1 Procedure OT Treatments $Self Care/Home Management : 23-37 mins  Gaye AlkenBailey A Jerrol Helmers M.S., OTR/L Pager: [phone]  09/27/2015, 1:40 PM

## 2015-09-27 NOTE — Progress Notes (Signed)
CSW c/s acknowledged.  Pt to return home with HHC, noted.  CSW signing off.  Please re-consult should any social work needs arise.  Pollyann SavoyJody Bertin Inabinet, LCSW Weekend Coverage [phone]

## 2015-09-27 NOTE — Progress Notes (Signed)
Utilization review completed.  

## 2015-09-27 NOTE — Evaluation (Signed)
Physical Therapy Evaluation Patient Details Name: Madison Hines MRN: [id number] DOB: 1951/03/17 Today's Date: 09/27/2015   History of Present Illness  65 yo s/p L2-3 decompression and fusion. PMH: bilateral partial shoulder replacements, bilateral THA, Back surgery, hypertension.  Clinical Impression  Patient is s/p above surgery resulting in the deficits listed below (see PT Problem List).  Patient will benefit from skilled PT to increase their independence and safety with mobility (while adhering to their precautions) to allow discharge to her daughter's home. Ultimately the patient states that she wants to be able to get back to living alone again. Pt limiting activity at this time due to pain, nursing notified.      Follow Up Recommendations Home health PT;Supervision for mobility/OOB    Equipment Recommendations  Rolling walker with 5" wheels    Recommendations for Other Services       Precautions / Restrictions Precautions Precautions: Back Precaution Comments: reviewed back precautions Required Braces or Orthoses: Spinal Brace Spinal Brace: Lumbar corset;Applied in sitting position Restrictions Weight Bearing Restrictions: No      Mobility  Bed Mobility Overal bed mobility: Needs Assistance Bed Mobility: Sidelying to Sit   Sidelying to sit: Min assist       General bed mobility comments: Pt on Lt side upon arrival, assist with trunk to get to sitting.   Transfers Overall transfer level: Needs assistance Equipment used: Rolling walker (2 wheeled) Transfers: Sit to/from Stand Sit to Stand: Min assist         General transfer comment: cues for spinal precautions with sit-stand. Asssit to control descent with sitting.   Ambulation/Gait Ambulation/Gait assistance: Min guard Ambulation Distance (Feet): 20 Feet Assistive device: Rolling walker (2 wheeled) Gait Pattern/deviations: Step-through pattern;Decreased step length - right;Decreased step length -  left Gait velocity: decreased   General Gait Details: slow pattern, no loss of balance. Pt refusing any further ambulation due to reports of pain.   Stairs            Wheelchair Mobility    Modified Rankin (Stroke Patients Only)       Balance Overall balance assessment: Needs assistance Sitting-balance support: Feet supported Sitting balance-Leahy Scale: Good     Standing balance support: Bilateral upper extremity supported Standing balance-Leahy Scale: Poor Standing balance comment: using rw for support                             Pertinent Vitals/Pain Pain Assessment: 0-10 Pain Score: 8  Pain Location: back Pain Descriptors / Indicators: Aching Pain Intervention(s): Limited activity within patient's tolerance;Monitored during session;Repositioned;Patient requesting pain meds-RN notified    Home Living Family/patient expects to be discharged to:: Private residence Living Arrangements: Alone Available Help at Discharge: Family;Available 24 hours/day Type of Home: House Home Access: Stairs to enter Entrance Stairs-Rails: None Entrance Stairs-Number of Steps: 2 Home Layout: One level Home Equipment: None Additional Comments: Pt reports that she is planning to stay with her daughter upon D/C. Daughters home is reflected above    Prior Function Level of Independence: Independent               Hand Dominance        Extremity/Trunk Assessment   Upper Extremity Assessment: Defer to OT evaluation           Lower Extremity Assessment: Generalized weakness         Communication   Communication: No difficulties  Cognition Arousal/Alertness: Awake/alert Behavior During Therapy:  WFL for tasks assessed/performed Overall Cognitive Status: Within Functional Limits for tasks assessed                      General Comments      Exercises        Assessment/Plan    PT Assessment Patient needs continued PT services  PT  Diagnosis Difficulty walking;Generalized weakness   PT Problem List Decreased strength;Decreased range of motion;Decreased activity tolerance;Decreased balance;Decreased mobility;Pain  PT Treatment Interventions DME instruction;Gait training;Stair training;Functional mobility training;Therapeutic activities;Therapeutic exercise;Balance training;Patient/family education   PT Goals (Current goals can be found in the Care Plan section) Acute Rehab PT Goals Patient Stated Goal: move without pain again.  PT Goal Formulation: With patient Time For Goal Achievement: 10/11/15 Potential to Achieve Goals: Good    Frequency Min 5X/week   Barriers to discharge        Co-evaluation               End of Session Equipment Utilized During Treatment: Gait belt;Back brace Activity Tolerance: Patient limited by pain Patient left: in chair;with call bell/phone within reach;with family/visitor present Nurse Communication: Mobility status;Patient requests pain meds         Time: 1607-3710 PT Time Calculation (min) (ACUTE ONLY): 27 min   Charges:   PT Evaluation $PT Eval Moderate Complexity: 1 Procedure PT Treatments $Therapeutic Activity: 8-22 mins   PT G Codes:        Christiane Ha, PT, CSCS Pager [phone] Office [phone]  09/27/2015, 9:44 AM

## 2015-09-27 NOTE — Progress Notes (Signed)
OT Cancellation Note  Patient Details Name: Madison Hines MRN: [id number] DOB: January 11, 1951   Cancelled Treatment:    Reason Eval/Treat Not Completed: Pain limiting ability to participate. Pt declining to work with OT at this time, states she just got back to bed and already worked with PT this AM. RN present in room giving pain medication. Will follow up for OT treatment as time allows.  Gaye AlkenBailey A Ramona Slinger M.S., OTR/L Pager: [phone]  09/27/2015, 10:25 AM

## 2015-09-27 NOTE — Progress Notes (Signed)
Patient ID: Madison Hines, female   DOB: 03/11/1951, 65 y.o.   MRN: [id number] Subjective:  The patient is alert and pleasant. She complains of back pain.  Objective: Vital signs in last 24 hours: Temp:  [97.7 F (36.5 C)-99 F (37.2 C)] 97.7 F (36.5 C) (04/23 0440) Pulse Rate:  [77-79] 79 (04/23 0440) Resp:  [16] 16 (04/23 0440) BP: (123-128)/(55-62) 128/55 mmHg (04/23 0440) SpO2:  [97 %-99 %] 99 % (04/23 0440)  Intake/Output from previous day: 04/22 0701 - 04/23 0700 In: 3 [I.V.:3] Out: 820 [Urine:700; Drains:120] Intake/Output this shift:    Physical exam the patient is alert and oriented. She is moving her lower extremities well. Her dressing is clean and dry. Her drain suction collection device and tubing had been removed. There is some bloody discharge on the bed sheeting. The drain itself was still in place. I removed it.  Lab Results: No results for input(s): WBC, HGB, HCT, PLT in the last 72 hours. BMET No results for input(s): NA, K, CL, CO2, GLUCOSE, BUN, CREATININE, CALCIUM in the last 72 hours.  Studies/Results: Dg Lumbar Spine 1 View  09/25/2015  CLINICAL DATA:  PLIF L2-3 RSTO: [facility] FLUORO TIME: 11SECS EXAM: LUMBAR SPINE - 1 VIEW; DG C-ARM 61-120 MIN COMPARISON:  08/28/2015 FINDINGS: Memory is based on previous CT scan 08/28/2015. Stable posterior fusion L3 -L4 with interbody fusion devices. Incomplete visualization of spine below this level. There are new transpedicular screws at L2. IMPRESSION: Posterior fusion of the lumbar spine. Electronically Signed   By: Norva PavlovElizabeth  Brown M.D.   On: 09/25/2015 12:33   Dg C-arm 1-60 Min  09/25/2015  CLINICAL DATA:  PLIF L2-3 RSTO: [facility] FLUORO TIME: 11SECS EXAM: LUMBAR SPINE - 1 VIEW; DG C-ARM 61-120 MIN COMPARISON:  08/28/2015 FINDINGS: Memory is based on previous CT scan 08/28/2015. Stable posterior fusion L3 -L4 with interbody fusion devices. Incomplete visualization of spine below this level. There are new transpedicular screws  at L2. IMPRESSION: Posterior fusion of the lumbar spine. Electronically Signed   By: Norva PavlovElizabeth  Brown M.D.   On: 09/25/2015 12:33    Assessment/Plan: Postop day #2: We will continue to mobilize the patient with PT. She will likely go home in a few days.  LOS: 2 days     Chinedu Agustin D 09/27/2015, 9:20 AM

## 2015-09-28 MED FILL — Heparin Sodium (Porcine) Inj 1000 Unit/ML: INTRAMUSCULAR | Qty: 30 | Status: AC

## 2015-09-28 MED FILL — Sodium Chloride IV Soln 0.9%: INTRAVENOUS | Qty: 1000 | Status: AC

## 2015-09-28 NOTE — Progress Notes (Signed)
Patient ID: Madison Hines, female   DOB: [birth year], 65 y.o.   MRN: [id number] C/o incisional pain. No weakness. bm positive

## 2015-09-28 NOTE — Care Management Important Message (Signed)
Important Message  Patient Details  Name: Madison Hines MRN: [id number] Date of Birth: Oct 19, 1950   Medicare Important Message Given:  Yes    Ludean Duhart P Kaydenn Mclear 09/28/2015, 1:22 PM

## 2015-09-28 NOTE — Progress Notes (Signed)
Occupational Therapy Treatment Patient Details Name: Madison Hines MRN: [id number] DOB: [birth year] Today's Date: 09/28/2015    History of present illness 65 yo s/p L2-3 decompression and fusion. PMH: bilateral partial shoulder replacements, bilateral THA, Back surgery, hypertension.   OT comments  Patient progressing towards OT goals, continue plan of care for now. Pt takes increased time to complete tasks, but is overall at a supervision level for functional mobility. Pt requires increased assistance with LB ADLs, but states she knows how to use AE taught by OT yesterday.   Follow Up Recommendations  Home health OT;Supervision - Intermittent    Equipment Recommendations  3 in 1 bedside comode;Other (comment) (RW)    Recommendations for Other Services  None at this time  Precautions / Restrictions Precautions Precautions: Back Precaution Comments: Pt able to recall back precautions with min verbal cues  Required Braces or Orthoses: Spinal Brace Spinal Brace: Lumbar corset;Applied in sitting position Restrictions Weight Bearing Restrictions: No    Mobility Bed Mobility Overal bed mobility: Needs Assistance Bed Mobility: Rolling;Sidelying to Sit Rolling: Min guard Sidelying to sit: Min guard General bed mobility comments: Min guard for safety. VCs for hand placement and technique. HOB down, but with use of bed rail.  Transfers Overall transfer level: Needs assistance Equipment used: Rolling walker (2 wheeled) Transfers: Sit to/from Stand Sit to Stand: Supervision   General transfer comment: Supervision for safety. Increased time required. VCs for hand placement.    Balance   Sitting-balance support: No upper extremity supported;Feet supported Sitting balance-Leahy Scale: Good     Standing balance support: During functional activity;No upper extremity supported Standing balance-Leahy Scale: Fair Standing balance comment: Pt able to stand at sink and complete grooming  activities with no UE support   ADL Overall ADL's : Needs assistance/impaired Eating/Feeding: Set up;Sitting   Grooming: Supervision/safety;Set up;Standing   Upper Body Bathing: Set up;Supervision/ safety;Sitting   Lower Body Bathing: Moderate assistance;Sit to/from stand Lower Body Bathing Details (indicate cue type and reason): Educated pt on use of long handled sponge Upper Body Dressing : Set up;Supervision/safety;Sitting Upper Body Dressing Details (indicate cue type and reason): to don brace     Toilet Transfer: Supervision/safety;RW;Ambulation;BSC   Toileting- Clothing Manipulation and Hygiene: Supervision/safety;Sit to/from stand       Functional mobility during ADLs: Supervision/safety;Cueing for safety;Rolling walker General ADL Comments: Educated pt on using rinse cup and spit cup for grooming tasks of brushing teeth at sink in order to adhere to back precautions. Pt only able to verbalize 1/3 back precautions initially, but after going over them and towards end of session she was able to verbalize 3/3 with min cueing     Cognition   Behavior During Therapy: Summit Asc LLPWFL for tasks assessed/performed Overall Cognitive Status: Within Functional Limits for tasks assessed       Memory: Decreased recall of precautions                 Pertinent Vitals/ Pain       Pain Assessment: 0-10 Pain Score: 10-Worst pain ever Pain Location: back incision  Pain Descriptors / Indicators: Aching;Sore;Operative site guarding Pain Intervention(s): Limited activity within patient's tolerance;Monitored during session;Repositioned         Frequency Min 3X/week     Progress Toward Goals  OT Goals(current goals can now be found in the care plan section)  Progress towards OT goals: Progressing toward goals  Acute Rehab OT Goals Patient Stated Goal: "get some sleep, I'm so tired!" OT Goal Formulation: With patient  Time For Goal Achievement: 10/10/15 Potential to Achieve Goals: Good   Plan Discharge plan remains appropriate    End of Session Equipment Utilized During Treatment: Rolling walker;Back brace   Activity Tolerance Patient tolerated treatment well   Patient Left in chair;with call bell/phone within reach    Time: 1453-1526 OT Time Calculation (min): 33 min  Charges: OT General Charges $OT Visit: 1 Procedure OT Treatments $Self Care/Home Management : 23-37 mins  Edwin Cap , MS, OTR/L, CLT Pager: [phone]  09/28/2015, 4:18 PM

## 2015-09-28 NOTE — Progress Notes (Signed)
Physical Therapy Treatment Patient Details Name: Madison Hines Chismar MRN: [id number] DOB: 03/18/1951 Today's Date: 09/28/2015    History of Present Illness 65 yo s/p L2-3 decompression and fusion. PMH: bilateral partial shoulder replacements, bilateral THA, Back surgery, hypertension.    PT Comments    Pt performed increased gait distance, slow but steady with no LOB.  Pt required cues to recall 3/3 back precautions.  Pt required cues to maintain back precautions during turns with RW and rolling in supine.  Plan to try stairs next visit to ensure safe entry into home at d/c.     Follow Up Recommendations  Home health PT;Supervision for mobility/OOB     Equipment Recommendations  Rolling walker with 5" wheels    Recommendations for Other Services       Precautions / Restrictions Precautions Precautions: Back Precaution Comments: Pt able to recall back precautions with min verbal cues  Required Braces or Orthoses: Spinal Brace Spinal Brace: Lumbar corset;Applied in sitting position Restrictions Weight Bearing Restrictions: No    Mobility  Bed Mobility Overal bed mobility: Needs Assistance Bed Mobility: Sit to Supine Rolling: Min assist Sidelying to sit: Min guard   Sit to supine: Min assist   General bed mobility comments: Min assist for lifting Hines LEs into bed for safety. VCs for hand placement and technique. HOB down, but with use of bed rail.  pt required cues to avoid twisting during rolling.    Transfers Overall transfer level: Needs assistance Equipment used: Rolling walker (2 wheeled) Transfers: Sit to/from Stand Sit to Stand: Supervision         General transfer comment: Supervision for safety. Increased time required. VCs for hand placement.  Ambulation/Gait Ambulation/Gait assistance: Min guard Ambulation Distance (Feet): 160 Feet Assistive device: Rolling walker (2 wheeled) Gait Pattern/deviations: Step-through pattern;Trunk flexed;Decreased stride  length Gait velocity: decreased   General Gait Details: slow pattern, no loss of balance. Pt required cues for postural control, forward gaze and increasing Hines step length.     Stairs            Wheelchair Mobility    Modified Rankin (Stroke Patients Only)       Balance Overall balance assessment: Needs assistance Sitting-balance support: No upper extremity supported Sitting balance-Leahy Scale: Good     Standing balance support: During functional activity;No upper extremity supported Standing balance-Leahy Scale: Fair Standing balance comment: Pt able to stand at sink and complete grooming activities with no UE support                    Cognition Arousal/Alertness: Lethargic;Suspect due to medications Behavior During Therapy: WFL for tasks assessed/performed Overall Cognitive Status: Within Functional Limits for tasks assessed       Memory: Decreased recall of precautions              Exercises      General Comments        Pertinent Vitals/Pain Pain Assessment: 0-10 Pain Score: 5  Pain Location: back incision.   Pain Descriptors / Indicators: Aching;Sore;Operative site guarding Pain Intervention(s): Monitored during session;Repositioned    Home Living                      Prior Function            PT Goals (current goals can now be found in the care plan section) Acute Rehab PT Goals Patient Stated Goal: "get some sleep, I'm so tired!" Potential to Achieve Goals: Good  Progress towards PT goals: Progressing toward goals    Frequency  Min 5X/week    PT Plan Current plan remains appropriate    Co-evaluation             End of Session Equipment Utilized During Treatment: Gait belt;Back brace Activity Tolerance: Patient limited by pain Patient left: in bed;with call bell/phone within reach     Time: 1610-9604 PT Time Calculation (min) (ACUTE ONLY): 31 min  Charges:  $Gait Training: 8-22 mins $Therapeutic  Activity: 8-22 mins                    G Codes:      Florestine Avers 10/22/15, 4:26 PM  Joycelyn Rua, PTA pager [phone]

## 2015-09-29 NOTE — Care Management Note (Signed)
Case Management Note  Patient Details  Name: Madison Hines MRN: [id number] Date of Birth: Mar 07, 1951  Subjective/Objective:  65 yr old female s/p L2-3 decompression and fusion.                   Action/Plan:  Case manager spoke with patient via telephone concerning home health and DME needs at discharge. Choice for Home Health was offered. Referral was called to Ayesha RumpfMary Yonjof RN, [facility]. Patient states she will have family support at discharge.   Expected Discharge Date:   09/30/15               Expected Discharge Plan:  Home w Home Health Services  In-House Referral:     Discharge planning Services  CM Consult  Post Acute Care Choice:  Durable Medical Equipment, Home Health Choice offered to:  Patient  DME Arranged:  3-N-1, Walker rolling DME Agency:  [facility]  [facility]  [facility]  Status of Service:  Completed, signed off  Medicare Important Message Given:  Yes Date Medicare IM Given:    Medicare IM give by:    Date Additional Medicare IM Given:    Additional Medicare Important Message give by:     If discussed at Long Length of Stay Meetings, dates discussed:    Additional Comments:  [name], [name], RN 09/29/2015, 10:55 AM

## 2015-09-29 NOTE — Progress Notes (Signed)
Patient ID: Madison Hines, female   DOB: 1950-10-23, 65 y.o.   MRN: [id number] Progressing, no weakness. Home in am

## 2015-09-29 NOTE — Progress Notes (Signed)
PT Cancellation Note  Patient Details Name: Madison Hines MRN: [id number] DOB: Oct 01, 1950   Cancelled Treatment:    Reason Eval/Treat Not Completed: Patient declined, no reason specified.  Pt just got dinner tray was not willing to do PT first.  Was agreeable to PT checking back in AM before d/c home.  Thanks,    Rollene Rotundaebecca B. Jenette Rayson, PT, DPT [phone]   09/29/2015, 4:57 PM

## 2015-09-30 NOTE — Progress Notes (Signed)
Physical Therapy Treatment Patient Details Name: Madison Hines MRN: [id number] DOB: 11/04/1950 Today's Date: 09/30/2015    History of Present Illness 65 yo s/p L2-3 decompression and fusion. PMH: bilateral partial shoulder replacements, bilateral THA, Back surgery, hypertension.    PT Comments    Pt performed stair training and gait training.  Pt remains guarded and lethargic.  Will continue POC during hospitalization.  Follow Up Recommendations  Home health PT;Supervision for mobility/OOB     Equipment Recommendations  Rolling walker with 5" wheels    Recommendations for Other Services       Precautions / Restrictions Precautions Precautions: Back Precaution Comments: Pt able to recall back precautions with min verbal cues  Required Braces or Orthoses: Spinal Brace Spinal Brace: Lumbar corset;Applied in sitting position (Pt required resizing of brace and donning with total assistance.  ) Restrictions Weight Bearing Restrictions: No    Mobility  Bed Mobility Overal bed mobility: Needs Assistance Bed Mobility: Sit to Supine Rolling: Min assist (with max cues ) Sidelying to sit: Min assist       General bed mobility comments: Pt required max VCs for rolling to maintain precautions.  Pt lethargic and required min assist to ascend from flat surface of bed.  Pt reports she has elevating HOB ability at home.    Transfers Overall transfer level: Needs assistance Equipment used: Rolling walker (2 wheeled) Transfers: Sit to/from Stand Sit to Stand: Supervision         General transfer comment: Supervision with increased time.  Vcs to push from seated surface.    Ambulation/Gait Ambulation/Gait assistance: Min guard Ambulation Distance (Feet): 200 Feet Assistive device: Rolling walker (2 wheeled) Gait Pattern/deviations: Step-through pattern;Trunk flexed;Decreased stride length Gait velocity: decreased   General Gait Details: slow pattern, no loss of balance. Pt  required cues for postural control, forward gaze and increasing B step length.  Adjusted RW height to improve posture.     Stairs            Wheelchair Mobility    Modified Rankin (Stroke Patients Only)       Balance Overall balance assessment: Needs assistance   Sitting balance-Leahy Scale: Good       Standing balance-Leahy Scale: Fair                      Cognition Arousal/Alertness: Lethargic;Suspect due to medications Behavior During Therapy: [facility] for tasks assessed/performed Overall Cognitive Status: Within Functional Limits for tasks assessed       Memory: Decreased recall of precautions              Exercises      General Comments        Pertinent Vitals/Pain Pain Assessment: 0-10 Pain Score: 4  Pain Descriptors / Indicators: Aching;Sore;Operative site guarding Pain Intervention(s): Monitored during session;Repositioned    Home Living                      Prior Function            PT Goals (current goals can now be found in the care plan section) Acute Rehab PT Goals Patient Stated Goal: "get some sleep, I'm so tired!" Potential to Achieve Goals: Good Progress towards PT goals: Progressing toward goals    Frequency  Min 5X/week    PT Plan Current plan remains appropriate    Co-evaluation             End of Session Equipment Utilized  During Treatment: Gait belt;Back brace Activity Tolerance: Patient limited by pain Patient left: in bed;with call bell/phone within reach     Time: 1213-1239 PT Time Calculation (min) (ACUTE ONLY): 26 min  Charges:  $Gait Training: 8-22 mins $Therapeutic Activity: 8-22 mins                    G Codes:      Florestine Aversimee J Leston Schueller 09/30/2015, 1:15 PM  Joycelyn RuaAimee Wendy Hoback, PTA pager [phone]

## 2015-09-30 NOTE — Progress Notes (Signed)
Patient ID: Madison Hines, female   DOB: Jan 01, 1951, 65 y.o.   MRN: [id number] stii with incisional pain. Wants to go home in am

## 2015-10-01 NOTE — Progress Notes (Signed)
Physical Therapy Treatment Patient Details Name: Madison Hines MRN: [id number] DOB: 04/29/1951 Today's Date: 10/01/2015    History of Present Illness 65 yo s/p L2-3 decompression and fusion. PMH: bilateral partial shoulder replacements, bilateral THA, Back surgery, hypertension.    PT Comments    Pt performed increased gait and successful completion of stair training.  Pt set for d/c home.    Follow Up Recommendations  Home health PT;Supervision for mobility/OOB     Equipment Recommendations  Rolling walker with 5" wheels    Recommendations for Other Services       Precautions / Restrictions Precautions Precautions: Back Precaution Comments: Pt able to recall back precautions with out cueing Required Braces or Orthoses: Spinal Brace Spinal Brace: Lumbar corset;Applied in sitting position Restrictions Weight Bearing Restrictions: No    Mobility  Bed Mobility Overal bed mobility: Needs Assistance Bed Mobility: Sidelying to Sit Rolling: Supervision Sidelying to sit: Supervision       General bed mobility comments: Cues for technique to maintain spinal precautions.  Pt required cues for body position and hand placement.    Transfers Overall transfer level: Needs assistance Equipment used: Rolling walker (2 wheeled)   Sit to Stand: Modified independent (Device/Increase time)         General transfer comment: Demonstrated good technique to and from seated surface.    Ambulation/Gait Ambulation/Gait assistance: Supervision Ambulation Distance (Feet): 450 Feet Assistive device: Rolling walker (2 wheeled)   Gait velocity: decreased   General Gait Details: slow pattern, no loss of balance. Pt required cues for postural control, forward gaze and increasing B step length.    Stairs Stairs: Yes Stairs assistance: Min assist Stair Management: No rails Number of Stairs: 2 General stair comments: Pt required cues for for sequencing and RW placement.  Min assist  to guard RW during stair negotiation.  Pt performed negotiation forward to descend and backwards to ascend.    Wheelchair Mobility    Modified Rankin (Stroke Patients Only)       Balance Overall balance assessment: Needs assistance   Sitting balance-Leahy Scale: Good       Standing balance-Leahy Scale: Fair                      Cognition Arousal/Alertness: Lethargic Behavior During Therapy: WFL for tasks assessed/performed Overall Cognitive Status: Within Functional Limits for tasks assessed       Memory:  (Pt with improved recall of spinal precautions.  )              Exercises      General Comments        Pertinent Vitals/Pain Pain Assessment: 0-10 Pain Score: 4  Pain Location: back Pain Descriptors / Indicators: Aching Pain Intervention(s): Limited activity within patient's tolerance;Repositioned    Home Living                      Prior Function            PT Goals (current goals can now be found in the care plan section) Acute Rehab PT Goals Patient Stated Goal: "get some sleep, I'm so tired!" Potential to Achieve Goals: Good Progress towards PT goals: Progressing toward goals    Frequency  Min 5X/week    PT Plan Current plan remains appropriate    Co-evaluation             End of Session Equipment Utilized During Treatment: Gait belt;Back brace Activity Tolerance: Patient limited  by pain Patient left: in bed;with call bell/phone within reach;with bed alarm set     Time: 0940-1004 PT Time Calculation (min) (ACUTE ONLY): 24 min  Charges:  $Gait Training: 23-37 mins                    G Codes:      Florestine Aversimee J Nuriya Stuck 10/01/2015, 10:16 AM  Joycelyn RuaAimee Sabriel Borromeo, PTA pager [phone]

## 2015-10-01 NOTE — Progress Notes (Signed)
Occupational Therapy Treatment Patient Details Name: Madison Hines MRN: [id number] DOB: 1951-05-28 Today's Date: 10/01/2015    History of present illness 65 yo s/p L2-3 decompression and fusion. PMH: bilateral partial shoulder replacements, bilateral THA, Back surgery, hypertension.   OT comments  Completed all education regarding compensatory techniques and use of AE and DME to increase independence with ADL and functional mobility for ADL and adherance to back precautions. Pt ready to D/C. Continue to recommend follow up with HHOT.   Follow Up Recommendations  Home health OT;Supervision - Intermittent    Equipment Recommendations  3 in 1 bedside comode;Other (comment)    Recommendations for Other Services      Precautions / Restrictions Precautions Precautions: Back Precaution Comments: Pt able to recall back precautions with min verbal cues  Required Braces or Orthoses: Spinal Brace Spinal Brace: Lumbar corset;Applied in sitting position       Mobility Bed Mobility Overal bed mobility: Needs Assistance Bed Mobility: Sidelying to Sit Rolling: Supervision         General bed mobility comments: withiout bed rails  Transfers          S with RW            Balance             Standing balance-Leahy Scale: Fair                     ADL Overall ADL's : Needs assistance/impaired                                     Functional mobility during ADLs: Supervision/safety;Rolling walker;Cueing for safety General ADL Comments: Completed education regarding compensatory techniques for ADL and use of AE as needed. Pt unable to cross feet over knees and needs to use AE to be mod I. Pt able ot recall all back precautions and how they affect her ability to complete ADL. Educatedon home safety and reducing risk of falls. Stressed importance of pt having S with all mobility and ADL initially. Pt verbalized understanding.       Vision                      Perception     Praxis      Cognition   Behavior During Therapy: WFL for tasks assessed/performed Overall Cognitive Status: Within Functional Limits for tasks assessed                       Extremity/Trunk Assessment               Exercises     Shoulder Instructions       General Comments      Pertinent Vitals/ Pain       Pain Assessment: 0-10 Pain Score: 4  Pain Descriptors / Indicators: Aching Pain Intervention(s): Limited activity within patient's tolerance  Home Living                                          Prior Functioning/Environment              Frequency Min 3X/week     Progress Toward Goals  OT Goals(current goals can now be found in the care plan section)  Progress towards OT goals:  Progressing toward goals  Acute Rehab OT Goals Patient Stated Goal: "get some sleep, I'm so tired!" OT Goal Formulation: With patient Time For Goal Achievement: 10/10/15 Potential to Achieve Goals: Good ADL Goals Pt Will Perform Lower Body Bathing: with set-up;with supervision;with adaptive equipment;sit to/from stand;with caregiver independent in assisting Pt Will Perform Lower Body Dressing: with set-up;with supervision;with adaptive equipment;with caregiver independent in assisting;sit to/from stand Pt Will Transfer to Toilet: with supervision;bedside commode;ambulating Pt Will Perform Toileting - Clothing Manipulation and hygiene: with supervision;with adaptive equipment;sit to/from stand Pt Will Perform Tub/Shower Transfer: Tub transfer;3 in 1;with min assist;with caregiver independent in assisting;rolling walker;ambulating Additional ADL Goal #1: Pt will verbalize 3/3 back precautions independently  Plan Discharge plan remains appropriate    Co-evaluation                 End of Session Equipment Utilized During Treatment: Gait belt;Rolling walker;Back brace   Activity Tolerance Patient tolerated  treatment well   Patient Left in chair;with call bell/phone within reach; chair alarm pad   Nurse Communication Other (comment) (reaady to D/C)        Time: [id number] OT Time Calculation (min): 22 min  Charges: OT General Charges $OT Visit: 1 Procedure OT Treatments $Self Care/Home Management : 8-22 mins  Youssouf Shipley,HILLARY 10/01/2015, 9:48 AM   Luisa DagoHilary Heather Mckendree, OTR/L  [phone] 10/01/2015

## 2015-10-01 NOTE — Discharge Summary (Signed)
Physician Discharge Summary  Patient ID: Madison Hines MRN: [id number] DOB/AGE: [birth year] 67 y.o.  Admit date: 09/25/2015 Discharge date: 10/01/2015  Admission Diagnoses:lumbar stenosis  Discharge Diagnoses:  Active Problems:   Lumbar stenosis   Discharged Condition: ambulating  Hospital Course: surgery  Consults: none  Significant Diagnostic Studies: myelogram  Treatments: decompression and fusion  Discharge Exam: Blood pressure 109/56, pulse 99, temperature 99.4 F (37.4 C), temperature source Oral, resp. rate 16, weight 87.998 kg (194 lb), SpO2 98 %. No weakness  Disposition: home     Medication List    ASK your doctor about these medications        amLODipine 5 MG tablet  Commonly known as:  NORVASC  Take 5 mg by mouth daily.     benazepril 10 MG tablet  Commonly known as:  LOTENSIN  Take 10 mg by mouth daily.     escitalopram 20 MG tablet  Commonly known as:  LEXAPRO  Take 20 mg by mouth daily.     ibuprofen 800 MG tablet  Commonly known as:  ADVIL,MOTRIN  Take 800 mg by mouth 2 (two) times daily. For pain     oxyCODONE 5 MG immediate release tablet  Commonly known as:  Oxy IR/ROXICODONE  Take 1 tablet by mouth every 4 (four) hours as needed for severe pain (for post op pain).     oxyCODONE-acetaminophen 10-325 MG tablet  Commonly known as:  PERCOCET  Take 1 tablet by mouth every 4 (four) hours as needed for pain. For pain     triamterene-hydrochlorothiazide 37.5-25 MG capsule  Commonly known as:  DYAZIDE  Take 1 capsule by mouth daily.           Follow-up Information    [facility]   Why:  [facility] contact you concerning start date and time for therapy.   Contact information:   [address] [place] [id number] [phone]       Signed: Karn CassisBOTERO,Wagner Tanzi M 10/01/2015, 9:21 AM

## 2015-10-01 NOTE — Care Management Important Message (Signed)
Important Message  Patient Details  Name: Madison Hines MRN: [id number] Date of Birth: June 18, 1950   Medicare Important Message Given:  Yes    Jarell Mcewen P Shawan Corella 10/01/2015, 3:06 PM

## 2015-10-01 NOTE — Progress Notes (Signed)
Reviewed discharge papers and medications with full understanding 

## 2015-10-02 DIAGNOSIS — Z79891 Long term (current) use of opiate analgesic: Secondary | ICD-10-CM | POA: Diagnosis not present

## 2015-10-02 DIAGNOSIS — I10 Essential (primary) hypertension: Secondary | ICD-10-CM | POA: Diagnosis not present

## 2015-10-02 DIAGNOSIS — M1990 Unspecified osteoarthritis, unspecified site: Secondary | ICD-10-CM | POA: Diagnosis not present

## 2015-10-02 DIAGNOSIS — Z981 Arthrodesis status: Secondary | ICD-10-CM | POA: Diagnosis not present

## 2015-10-02 DIAGNOSIS — Z96643 Presence of artificial hip joint, bilateral: Secondary | ICD-10-CM | POA: Diagnosis not present

## 2015-10-02 DIAGNOSIS — Z96611 Presence of right artificial shoulder joint: Secondary | ICD-10-CM | POA: Diagnosis not present

## 2015-10-02 DIAGNOSIS — Z4789 Encounter for other orthopedic aftercare: Secondary | ICD-10-CM | POA: Diagnosis not present

## 2015-10-02 DIAGNOSIS — Z961 Presence of intraocular lens: Secondary | ICD-10-CM | POA: Diagnosis not present

## 2015-10-02 DIAGNOSIS — Z96612 Presence of left artificial shoulder joint: Secondary | ICD-10-CM | POA: Diagnosis not present

## 2015-10-02 DIAGNOSIS — M4326 Fusion of spine, lumbar region: Secondary | ICD-10-CM | POA: Diagnosis not present

## 2015-10-05 DIAGNOSIS — Z96612 Presence of left artificial shoulder joint: Secondary | ICD-10-CM | POA: Diagnosis not present

## 2015-10-05 DIAGNOSIS — I10 Essential (primary) hypertension: Secondary | ICD-10-CM | POA: Diagnosis not present

## 2015-10-05 DIAGNOSIS — Z4789 Encounter for other orthopedic aftercare: Secondary | ICD-10-CM | POA: Diagnosis not present

## 2015-10-05 DIAGNOSIS — M1990 Unspecified osteoarthritis, unspecified site: Secondary | ICD-10-CM | POA: Diagnosis not present

## 2015-10-05 DIAGNOSIS — M4326 Fusion of spine, lumbar region: Secondary | ICD-10-CM | POA: Diagnosis not present

## 2015-10-05 DIAGNOSIS — Z961 Presence of intraocular lens: Secondary | ICD-10-CM | POA: Diagnosis not present

## 2015-10-05 DIAGNOSIS — Z981 Arthrodesis status: Secondary | ICD-10-CM | POA: Diagnosis not present

## 2015-10-05 DIAGNOSIS — Z96643 Presence of artificial hip joint, bilateral: Secondary | ICD-10-CM | POA: Diagnosis not present

## 2015-10-05 DIAGNOSIS — Z96611 Presence of right artificial shoulder joint: Secondary | ICD-10-CM | POA: Diagnosis not present

## 2015-10-05 DIAGNOSIS — Z79891 Long term (current) use of opiate analgesic: Secondary | ICD-10-CM | POA: Diagnosis not present

## 2015-10-07 DIAGNOSIS — M1990 Unspecified osteoarthritis, unspecified site: Secondary | ICD-10-CM | POA: Diagnosis not present

## 2015-10-07 DIAGNOSIS — I10 Essential (primary) hypertension: Secondary | ICD-10-CM | POA: Diagnosis not present

## 2015-10-07 DIAGNOSIS — Z96643 Presence of artificial hip joint, bilateral: Secondary | ICD-10-CM | POA: Diagnosis not present

## 2015-10-07 DIAGNOSIS — Z961 Presence of intraocular lens: Secondary | ICD-10-CM | POA: Diagnosis not present

## 2015-10-07 DIAGNOSIS — Z981 Arthrodesis status: Secondary | ICD-10-CM | POA: Diagnosis not present

## 2015-10-07 DIAGNOSIS — Z79891 Long term (current) use of opiate analgesic: Secondary | ICD-10-CM | POA: Diagnosis not present

## 2015-10-07 DIAGNOSIS — Z96612 Presence of left artificial shoulder joint: Secondary | ICD-10-CM | POA: Diagnosis not present

## 2015-10-07 DIAGNOSIS — M4326 Fusion of spine, lumbar region: Secondary | ICD-10-CM | POA: Diagnosis not present

## 2015-10-07 DIAGNOSIS — Z96611 Presence of right artificial shoulder joint: Secondary | ICD-10-CM | POA: Diagnosis not present

## 2015-10-07 DIAGNOSIS — Z4789 Encounter for other orthopedic aftercare: Secondary | ICD-10-CM | POA: Diagnosis not present

## 2015-10-08 DIAGNOSIS — M545 Low back pain: Secondary | ICD-10-CM | POA: Diagnosis not present

## 2015-10-09 DIAGNOSIS — Z961 Presence of intraocular lens: Secondary | ICD-10-CM | POA: Diagnosis not present

## 2015-10-09 DIAGNOSIS — Z96611 Presence of right artificial shoulder joint: Secondary | ICD-10-CM | POA: Diagnosis not present

## 2015-10-09 DIAGNOSIS — Z981 Arthrodesis status: Secondary | ICD-10-CM | POA: Diagnosis not present

## 2015-10-09 DIAGNOSIS — I10 Essential (primary) hypertension: Secondary | ICD-10-CM | POA: Diagnosis not present

## 2015-10-09 DIAGNOSIS — Z96612 Presence of left artificial shoulder joint: Secondary | ICD-10-CM | POA: Diagnosis not present

## 2015-10-09 DIAGNOSIS — Z96643 Presence of artificial hip joint, bilateral: Secondary | ICD-10-CM | POA: Diagnosis not present

## 2015-10-09 DIAGNOSIS — Z79891 Long term (current) use of opiate analgesic: Secondary | ICD-10-CM | POA: Diagnosis not present

## 2015-10-09 DIAGNOSIS — M1990 Unspecified osteoarthritis, unspecified site: Secondary | ICD-10-CM | POA: Diagnosis not present

## 2015-10-09 DIAGNOSIS — Z4789 Encounter for other orthopedic aftercare: Secondary | ICD-10-CM | POA: Diagnosis not present

## 2015-10-09 DIAGNOSIS — M4326 Fusion of spine, lumbar region: Secondary | ICD-10-CM | POA: Diagnosis not present

## 2015-10-12 DIAGNOSIS — I10 Essential (primary) hypertension: Secondary | ICD-10-CM | POA: Diagnosis not present

## 2015-10-12 DIAGNOSIS — Z4789 Encounter for other orthopedic aftercare: Secondary | ICD-10-CM | POA: Diagnosis not present

## 2015-10-12 DIAGNOSIS — Z96612 Presence of left artificial shoulder joint: Secondary | ICD-10-CM | POA: Diagnosis not present

## 2015-10-12 DIAGNOSIS — Z79891 Long term (current) use of opiate analgesic: Secondary | ICD-10-CM | POA: Diagnosis not present

## 2015-10-12 DIAGNOSIS — M4326 Fusion of spine, lumbar region: Secondary | ICD-10-CM | POA: Diagnosis not present

## 2015-10-12 DIAGNOSIS — Z96611 Presence of right artificial shoulder joint: Secondary | ICD-10-CM | POA: Diagnosis not present

## 2015-10-12 DIAGNOSIS — Z961 Presence of intraocular lens: Secondary | ICD-10-CM | POA: Diagnosis not present

## 2015-10-12 DIAGNOSIS — Z96643 Presence of artificial hip joint, bilateral: Secondary | ICD-10-CM | POA: Diagnosis not present

## 2015-10-12 DIAGNOSIS — Z981 Arthrodesis status: Secondary | ICD-10-CM | POA: Diagnosis not present

## 2015-10-12 DIAGNOSIS — M1990 Unspecified osteoarthritis, unspecified site: Secondary | ICD-10-CM | POA: Diagnosis not present

## 2015-10-13 DIAGNOSIS — Z981 Arthrodesis status: Secondary | ICD-10-CM | POA: Diagnosis not present

## 2015-10-13 DIAGNOSIS — M4326 Fusion of spine, lumbar region: Secondary | ICD-10-CM | POA: Diagnosis not present

## 2015-10-13 DIAGNOSIS — Z96612 Presence of left artificial shoulder joint: Secondary | ICD-10-CM | POA: Diagnosis not present

## 2015-10-13 DIAGNOSIS — M1990 Unspecified osteoarthritis, unspecified site: Secondary | ICD-10-CM | POA: Diagnosis not present

## 2015-10-13 DIAGNOSIS — Z79891 Long term (current) use of opiate analgesic: Secondary | ICD-10-CM | POA: Diagnosis not present

## 2015-10-13 DIAGNOSIS — Z4789 Encounter for other orthopedic aftercare: Secondary | ICD-10-CM | POA: Diagnosis not present

## 2015-10-13 DIAGNOSIS — I10 Essential (primary) hypertension: Secondary | ICD-10-CM | POA: Diagnosis not present

## 2015-10-13 DIAGNOSIS — Z961 Presence of intraocular lens: Secondary | ICD-10-CM | POA: Diagnosis not present

## 2015-10-13 DIAGNOSIS — Z96611 Presence of right artificial shoulder joint: Secondary | ICD-10-CM | POA: Diagnosis not present

## 2015-10-13 DIAGNOSIS — Z96643 Presence of artificial hip joint, bilateral: Secondary | ICD-10-CM | POA: Diagnosis not present

## 2015-10-15 DIAGNOSIS — Z79891 Long term (current) use of opiate analgesic: Secondary | ICD-10-CM | POA: Diagnosis not present

## 2015-10-15 DIAGNOSIS — I10 Essential (primary) hypertension: Secondary | ICD-10-CM | POA: Diagnosis not present

## 2015-10-15 DIAGNOSIS — Z961 Presence of intraocular lens: Secondary | ICD-10-CM | POA: Diagnosis not present

## 2015-10-15 DIAGNOSIS — Z4789 Encounter for other orthopedic aftercare: Secondary | ICD-10-CM | POA: Diagnosis not present

## 2015-10-15 DIAGNOSIS — Z96643 Presence of artificial hip joint, bilateral: Secondary | ICD-10-CM | POA: Diagnosis not present

## 2015-10-15 DIAGNOSIS — Z96612 Presence of left artificial shoulder joint: Secondary | ICD-10-CM | POA: Diagnosis not present

## 2015-10-15 DIAGNOSIS — Z96611 Presence of right artificial shoulder joint: Secondary | ICD-10-CM | POA: Diagnosis not present

## 2015-10-15 DIAGNOSIS — M1990 Unspecified osteoarthritis, unspecified site: Secondary | ICD-10-CM | POA: Diagnosis not present

## 2015-10-15 DIAGNOSIS — M4326 Fusion of spine, lumbar region: Secondary | ICD-10-CM | POA: Diagnosis not present

## 2015-10-15 DIAGNOSIS — Z981 Arthrodesis status: Secondary | ICD-10-CM | POA: Diagnosis not present

## 2015-10-16 DIAGNOSIS — Z96611 Presence of right artificial shoulder joint: Secondary | ICD-10-CM | POA: Diagnosis not present

## 2015-10-16 DIAGNOSIS — I10 Essential (primary) hypertension: Secondary | ICD-10-CM | POA: Diagnosis not present

## 2015-10-16 DIAGNOSIS — Z96612 Presence of left artificial shoulder joint: Secondary | ICD-10-CM | POA: Diagnosis not present

## 2015-10-16 DIAGNOSIS — Z79891 Long term (current) use of opiate analgesic: Secondary | ICD-10-CM | POA: Diagnosis not present

## 2015-10-16 DIAGNOSIS — Z96643 Presence of artificial hip joint, bilateral: Secondary | ICD-10-CM | POA: Diagnosis not present

## 2015-10-16 DIAGNOSIS — Z981 Arthrodesis status: Secondary | ICD-10-CM | POA: Diagnosis not present

## 2015-10-16 DIAGNOSIS — M1990 Unspecified osteoarthritis, unspecified site: Secondary | ICD-10-CM | POA: Diagnosis not present

## 2015-10-16 DIAGNOSIS — Z4789 Encounter for other orthopedic aftercare: Secondary | ICD-10-CM | POA: Diagnosis not present

## 2015-10-16 DIAGNOSIS — M4326 Fusion of spine, lumbar region: Secondary | ICD-10-CM | POA: Diagnosis not present

## 2015-10-16 DIAGNOSIS — Z961 Presence of intraocular lens: Secondary | ICD-10-CM | POA: Diagnosis not present

## 2015-10-19 DIAGNOSIS — Z981 Arthrodesis status: Secondary | ICD-10-CM | POA: Diagnosis not present

## 2015-10-19 DIAGNOSIS — Z79891 Long term (current) use of opiate analgesic: Secondary | ICD-10-CM | POA: Diagnosis not present

## 2015-10-19 DIAGNOSIS — M4326 Fusion of spine, lumbar region: Secondary | ICD-10-CM | POA: Diagnosis not present

## 2015-10-19 DIAGNOSIS — Z96611 Presence of right artificial shoulder joint: Secondary | ICD-10-CM | POA: Diagnosis not present

## 2015-10-19 DIAGNOSIS — Z96643 Presence of artificial hip joint, bilateral: Secondary | ICD-10-CM | POA: Diagnosis not present

## 2015-10-19 DIAGNOSIS — I10 Essential (primary) hypertension: Secondary | ICD-10-CM | POA: Diagnosis not present

## 2015-10-19 DIAGNOSIS — M1990 Unspecified osteoarthritis, unspecified site: Secondary | ICD-10-CM | POA: Diagnosis not present

## 2015-10-19 DIAGNOSIS — Z96612 Presence of left artificial shoulder joint: Secondary | ICD-10-CM | POA: Diagnosis not present

## 2015-10-19 DIAGNOSIS — Z961 Presence of intraocular lens: Secondary | ICD-10-CM | POA: Diagnosis not present

## 2015-10-19 DIAGNOSIS — Z4789 Encounter for other orthopedic aftercare: Secondary | ICD-10-CM | POA: Diagnosis not present

## 2015-10-21 DIAGNOSIS — M1990 Unspecified osteoarthritis, unspecified site: Secondary | ICD-10-CM | POA: Diagnosis not present

## 2015-10-21 DIAGNOSIS — Z79891 Long term (current) use of opiate analgesic: Secondary | ICD-10-CM | POA: Diagnosis not present

## 2015-10-21 DIAGNOSIS — Z4789 Encounter for other orthopedic aftercare: Secondary | ICD-10-CM | POA: Diagnosis not present

## 2015-10-21 DIAGNOSIS — Z96612 Presence of left artificial shoulder joint: Secondary | ICD-10-CM | POA: Diagnosis not present

## 2015-10-21 DIAGNOSIS — Z981 Arthrodesis status: Secondary | ICD-10-CM | POA: Diagnosis not present

## 2015-10-21 DIAGNOSIS — M4326 Fusion of spine, lumbar region: Secondary | ICD-10-CM | POA: Diagnosis not present

## 2015-10-21 DIAGNOSIS — I10 Essential (primary) hypertension: Secondary | ICD-10-CM | POA: Diagnosis not present

## 2015-10-21 DIAGNOSIS — Z96643 Presence of artificial hip joint, bilateral: Secondary | ICD-10-CM | POA: Diagnosis not present

## 2015-10-21 DIAGNOSIS — Z96611 Presence of right artificial shoulder joint: Secondary | ICD-10-CM | POA: Diagnosis not present

## 2015-10-21 DIAGNOSIS — Z961 Presence of intraocular lens: Secondary | ICD-10-CM | POA: Diagnosis not present

## 2015-10-26 DIAGNOSIS — Z4789 Encounter for other orthopedic aftercare: Secondary | ICD-10-CM | POA: Diagnosis not present

## 2015-10-26 DIAGNOSIS — Z961 Presence of intraocular lens: Secondary | ICD-10-CM | POA: Diagnosis not present

## 2015-10-26 DIAGNOSIS — Z96611 Presence of right artificial shoulder joint: Secondary | ICD-10-CM | POA: Diagnosis not present

## 2015-10-26 DIAGNOSIS — Z981 Arthrodesis status: Secondary | ICD-10-CM | POA: Diagnosis not present

## 2015-10-26 DIAGNOSIS — M1990 Unspecified osteoarthritis, unspecified site: Secondary | ICD-10-CM | POA: Diagnosis not present

## 2015-10-26 DIAGNOSIS — I10 Essential (primary) hypertension: Secondary | ICD-10-CM | POA: Diagnosis not present

## 2015-10-26 DIAGNOSIS — Z96612 Presence of left artificial shoulder joint: Secondary | ICD-10-CM | POA: Diagnosis not present

## 2015-10-26 DIAGNOSIS — Z96643 Presence of artificial hip joint, bilateral: Secondary | ICD-10-CM | POA: Diagnosis not present

## 2015-10-26 DIAGNOSIS — Z79891 Long term (current) use of opiate analgesic: Secondary | ICD-10-CM | POA: Diagnosis not present

## 2015-10-26 DIAGNOSIS — M4326 Fusion of spine, lumbar region: Secondary | ICD-10-CM | POA: Diagnosis not present

## 2015-10-28 DIAGNOSIS — Z981 Arthrodesis status: Secondary | ICD-10-CM | POA: Diagnosis not present

## 2015-10-28 DIAGNOSIS — Z96611 Presence of right artificial shoulder joint: Secondary | ICD-10-CM | POA: Diagnosis not present

## 2015-10-28 DIAGNOSIS — Z4789 Encounter for other orthopedic aftercare: Secondary | ICD-10-CM | POA: Diagnosis not present

## 2015-10-28 DIAGNOSIS — M4326 Fusion of spine, lumbar region: Secondary | ICD-10-CM | POA: Diagnosis not present

## 2015-10-28 DIAGNOSIS — Z96612 Presence of left artificial shoulder joint: Secondary | ICD-10-CM | POA: Diagnosis not present

## 2015-10-28 DIAGNOSIS — I10 Essential (primary) hypertension: Secondary | ICD-10-CM | POA: Diagnosis not present

## 2015-10-28 DIAGNOSIS — Z96643 Presence of artificial hip joint, bilateral: Secondary | ICD-10-CM | POA: Diagnosis not present

## 2015-10-28 DIAGNOSIS — Z961 Presence of intraocular lens: Secondary | ICD-10-CM | POA: Diagnosis not present

## 2015-10-28 DIAGNOSIS — Z79891 Long term (current) use of opiate analgesic: Secondary | ICD-10-CM | POA: Diagnosis not present

## 2015-10-28 DIAGNOSIS — M1990 Unspecified osteoarthritis, unspecified site: Secondary | ICD-10-CM | POA: Diagnosis not present

## 2015-11-02 DIAGNOSIS — Z4789 Encounter for other orthopedic aftercare: Secondary | ICD-10-CM | POA: Diagnosis not present

## 2015-11-02 DIAGNOSIS — Z96611 Presence of right artificial shoulder joint: Secondary | ICD-10-CM | POA: Diagnosis not present

## 2015-11-02 DIAGNOSIS — Z981 Arthrodesis status: Secondary | ICD-10-CM | POA: Diagnosis not present

## 2015-11-02 DIAGNOSIS — Z79891 Long term (current) use of opiate analgesic: Secondary | ICD-10-CM | POA: Diagnosis not present

## 2015-11-02 DIAGNOSIS — M4326 Fusion of spine, lumbar region: Secondary | ICD-10-CM | POA: Diagnosis not present

## 2015-11-02 DIAGNOSIS — Z96612 Presence of left artificial shoulder joint: Secondary | ICD-10-CM | POA: Diagnosis not present

## 2015-11-02 DIAGNOSIS — Z961 Presence of intraocular lens: Secondary | ICD-10-CM | POA: Diagnosis not present

## 2015-11-02 DIAGNOSIS — I10 Essential (primary) hypertension: Secondary | ICD-10-CM | POA: Diagnosis not present

## 2015-11-02 DIAGNOSIS — Z96643 Presence of artificial hip joint, bilateral: Secondary | ICD-10-CM | POA: Diagnosis not present

## 2015-11-02 DIAGNOSIS — M1990 Unspecified osteoarthritis, unspecified site: Secondary | ICD-10-CM | POA: Diagnosis not present

## 2015-11-03 DIAGNOSIS — M961 Postlaminectomy syndrome, not elsewhere classified: Secondary | ICD-10-CM | POA: Diagnosis not present

## 2015-11-03 DIAGNOSIS — G894 Chronic pain syndrome: Secondary | ICD-10-CM | POA: Diagnosis not present

## 2015-11-03 DIAGNOSIS — M4806 Spinal stenosis, lumbar region: Secondary | ICD-10-CM | POA: Diagnosis not present

## 2015-11-03 DIAGNOSIS — Z79891 Long term (current) use of opiate analgesic: Secondary | ICD-10-CM | POA: Diagnosis not present

## 2015-11-04 DIAGNOSIS — Z961 Presence of intraocular lens: Secondary | ICD-10-CM | POA: Diagnosis not present

## 2015-11-04 DIAGNOSIS — Z96611 Presence of right artificial shoulder joint: Secondary | ICD-10-CM | POA: Diagnosis not present

## 2015-11-04 DIAGNOSIS — Z96643 Presence of artificial hip joint, bilateral: Secondary | ICD-10-CM | POA: Diagnosis not present

## 2015-11-04 DIAGNOSIS — Z96612 Presence of left artificial shoulder joint: Secondary | ICD-10-CM | POA: Diagnosis not present

## 2015-11-04 DIAGNOSIS — M1990 Unspecified osteoarthritis, unspecified site: Secondary | ICD-10-CM | POA: Diagnosis not present

## 2015-11-04 DIAGNOSIS — I10 Essential (primary) hypertension: Secondary | ICD-10-CM | POA: Diagnosis not present

## 2015-11-04 DIAGNOSIS — M4326 Fusion of spine, lumbar region: Secondary | ICD-10-CM | POA: Diagnosis not present

## 2015-11-04 DIAGNOSIS — Z981 Arthrodesis status: Secondary | ICD-10-CM | POA: Diagnosis not present

## 2015-11-04 DIAGNOSIS — Z4789 Encounter for other orthopedic aftercare: Secondary | ICD-10-CM | POA: Diagnosis not present

## 2015-11-04 DIAGNOSIS — Z79891 Long term (current) use of opiate analgesic: Secondary | ICD-10-CM | POA: Diagnosis not present

## 2015-11-05 DIAGNOSIS — M545 Low back pain: Secondary | ICD-10-CM | POA: Diagnosis not present

## 2015-11-09 DIAGNOSIS — Z96612 Presence of left artificial shoulder joint: Secondary | ICD-10-CM | POA: Diagnosis not present

## 2015-11-09 DIAGNOSIS — M4326 Fusion of spine, lumbar region: Secondary | ICD-10-CM | POA: Diagnosis not present

## 2015-11-09 DIAGNOSIS — I10 Essential (primary) hypertension: Secondary | ICD-10-CM | POA: Diagnosis not present

## 2015-11-09 DIAGNOSIS — Z79891 Long term (current) use of opiate analgesic: Secondary | ICD-10-CM | POA: Diagnosis not present

## 2015-11-09 DIAGNOSIS — Z981 Arthrodesis status: Secondary | ICD-10-CM | POA: Diagnosis not present

## 2015-11-09 DIAGNOSIS — Z4789 Encounter for other orthopedic aftercare: Secondary | ICD-10-CM | POA: Diagnosis not present

## 2015-11-09 DIAGNOSIS — Z961 Presence of intraocular lens: Secondary | ICD-10-CM | POA: Diagnosis not present

## 2015-11-09 DIAGNOSIS — Z96643 Presence of artificial hip joint, bilateral: Secondary | ICD-10-CM | POA: Diagnosis not present

## 2015-11-09 DIAGNOSIS — M1990 Unspecified osteoarthritis, unspecified site: Secondary | ICD-10-CM | POA: Diagnosis not present

## 2015-11-09 DIAGNOSIS — Z96611 Presence of right artificial shoulder joint: Secondary | ICD-10-CM | POA: Diagnosis not present

## 2015-11-13 DIAGNOSIS — Z961 Presence of intraocular lens: Secondary | ICD-10-CM | POA: Diagnosis not present

## 2015-11-13 DIAGNOSIS — I10 Essential (primary) hypertension: Secondary | ICD-10-CM | POA: Diagnosis not present

## 2015-11-13 DIAGNOSIS — M1990 Unspecified osteoarthritis, unspecified site: Secondary | ICD-10-CM | POA: Diagnosis not present

## 2015-11-13 DIAGNOSIS — Z4789 Encounter for other orthopedic aftercare: Secondary | ICD-10-CM | POA: Diagnosis not present

## 2015-11-13 DIAGNOSIS — Z96611 Presence of right artificial shoulder joint: Secondary | ICD-10-CM | POA: Diagnosis not present

## 2015-11-13 DIAGNOSIS — Z79891 Long term (current) use of opiate analgesic: Secondary | ICD-10-CM | POA: Diagnosis not present

## 2015-11-13 DIAGNOSIS — Z96612 Presence of left artificial shoulder joint: Secondary | ICD-10-CM | POA: Diagnosis not present

## 2015-11-13 DIAGNOSIS — M4326 Fusion of spine, lumbar region: Secondary | ICD-10-CM | POA: Diagnosis not present

## 2015-11-13 DIAGNOSIS — Z96643 Presence of artificial hip joint, bilateral: Secondary | ICD-10-CM | POA: Diagnosis not present

## 2015-11-13 DIAGNOSIS — Z981 Arthrodesis status: Secondary | ICD-10-CM | POA: Diagnosis not present

## 2015-12-03 DIAGNOSIS — J940 Chylous effusion: Secondary | ICD-10-CM | POA: Diagnosis not present

## 2015-12-03 DIAGNOSIS — I10 Essential (primary) hypertension: Secondary | ICD-10-CM | POA: Diagnosis not present

## 2015-12-30 DIAGNOSIS — Z79891 Long term (current) use of opiate analgesic: Secondary | ICD-10-CM | POA: Diagnosis not present

## 2015-12-30 DIAGNOSIS — M4806 Spinal stenosis, lumbar region: Secondary | ICD-10-CM | POA: Diagnosis not present

## 2015-12-30 DIAGNOSIS — M961 Postlaminectomy syndrome, not elsewhere classified: Secondary | ICD-10-CM | POA: Diagnosis not present

## 2015-12-30 DIAGNOSIS — G894 Chronic pain syndrome: Secondary | ICD-10-CM | POA: Diagnosis not present

## 2016-02-25 DIAGNOSIS — Z79891 Long term (current) use of opiate analgesic: Secondary | ICD-10-CM | POA: Diagnosis not present

## 2016-02-25 DIAGNOSIS — R51 Headache: Secondary | ICD-10-CM | POA: Diagnosis not present

## 2016-02-25 DIAGNOSIS — G894 Chronic pain syndrome: Secondary | ICD-10-CM | POA: Diagnosis not present

## 2016-02-25 DIAGNOSIS — M961 Postlaminectomy syndrome, not elsewhere classified: Secondary | ICD-10-CM | POA: Diagnosis not present

## 2016-04-14 DIAGNOSIS — G894 Chronic pain syndrome: Secondary | ICD-10-CM | POA: Diagnosis not present

## 2016-04-14 DIAGNOSIS — J440 Chronic obstructive pulmonary disease with acute lower respiratory infection: Secondary | ICD-10-CM | POA: Diagnosis not present

## 2016-04-14 DIAGNOSIS — K209 Esophagitis, unspecified: Secondary | ICD-10-CM | POA: Diagnosis not present

## 2016-04-14 DIAGNOSIS — I10 Essential (primary) hypertension: Secondary | ICD-10-CM | POA: Diagnosis not present

## 2016-04-21 DIAGNOSIS — G894 Chronic pain syndrome: Secondary | ICD-10-CM | POA: Diagnosis not present

## 2016-04-21 DIAGNOSIS — M961 Postlaminectomy syndrome, not elsewhere classified: Secondary | ICD-10-CM | POA: Diagnosis not present

## 2016-04-21 DIAGNOSIS — R51 Headache: Secondary | ICD-10-CM | POA: Diagnosis not present

## 2016-04-21 DIAGNOSIS — Z79891 Long term (current) use of opiate analgesic: Secondary | ICD-10-CM | POA: Diagnosis not present

## 2016-07-21 DIAGNOSIS — Z79891 Long term (current) use of opiate analgesic: Secondary | ICD-10-CM | POA: Diagnosis not present

## 2016-07-21 DIAGNOSIS — M961 Postlaminectomy syndrome, not elsewhere classified: Secondary | ICD-10-CM | POA: Diagnosis not present

## 2016-07-21 DIAGNOSIS — M47812 Spondylosis without myelopathy or radiculopathy, cervical region: Secondary | ICD-10-CM | POA: Diagnosis not present

## 2016-07-21 DIAGNOSIS — G894 Chronic pain syndrome: Secondary | ICD-10-CM | POA: Diagnosis not present

## 2016-08-25 DIAGNOSIS — D6489 Other specified anemias: Secondary | ICD-10-CM | POA: Diagnosis not present

## 2016-08-25 DIAGNOSIS — J440 Chronic obstructive pulmonary disease with acute lower respiratory infection: Secondary | ICD-10-CM | POA: Diagnosis not present

## 2016-08-25 DIAGNOSIS — D509 Iron deficiency anemia, unspecified: Secondary | ICD-10-CM | POA: Diagnosis not present

## 2016-08-25 DIAGNOSIS — I10 Essential (primary) hypertension: Secondary | ICD-10-CM | POA: Diagnosis not present

## 2016-09-08 DIAGNOSIS — D6489 Other specified anemias: Secondary | ICD-10-CM | POA: Diagnosis not present

## 2016-09-09 ENCOUNTER — Encounter: Payer: Self-pay | Admitting: Gastroenterology

## 2016-09-22 DIAGNOSIS — G894 Chronic pain syndrome: Secondary | ICD-10-CM | POA: Diagnosis not present

## 2016-09-22 DIAGNOSIS — M47812 Spondylosis without myelopathy or radiculopathy, cervical region: Secondary | ICD-10-CM | POA: Diagnosis not present

## 2016-09-22 DIAGNOSIS — Z79891 Long term (current) use of opiate analgesic: Secondary | ICD-10-CM | POA: Diagnosis not present

## 2016-09-22 DIAGNOSIS — M961 Postlaminectomy syndrome, not elsewhere classified: Secondary | ICD-10-CM | POA: Diagnosis not present

## 2016-09-29 ENCOUNTER — Encounter: Payer: Self-pay | Admitting: Gastroenterology

## 2016-10-27 ENCOUNTER — Ambulatory Visit (AMBULATORY_SURGERY_CENTER): Payer: Self-pay | Admitting: *Deleted

## 2016-10-27 ENCOUNTER — Encounter: Payer: Self-pay | Admitting: Gastroenterology

## 2016-10-27 VITALS — Ht 63.0 in | Wt 191.8 lb

## 2016-10-27 DIAGNOSIS — Z1211 Encounter for screening for malignant neoplasm of colon: Secondary | ICD-10-CM

## 2016-10-27 DIAGNOSIS — D649 Anemia, unspecified: Secondary | ICD-10-CM | POA: Diagnosis not present

## 2016-10-27 MED ORDER — NA SULFATE-K SULFATE-MG SULF 17.5-3.13-1.6 GM/177ML PO SOLN: 1.0000 | Freq: Once | ORAL | 0 refills | Status: AC

## 2016-10-27 NOTE — Progress Notes (Signed)
No egg or soy allergy known to patient  No issues with past sedation with any surgeries  or procedures, no intubation problems  No diet pills per patient No home 02 use per patient  No blood thinners per patient  Pt denies issues with constipation  No A fib or A flutter  EMMI video sent to pt's e mail  

## 2016-11-04 DIAGNOSIS — Z1231 Encounter for screening mammogram for malignant neoplasm of breast: Secondary | ICD-10-CM | POA: Diagnosis not present

## 2016-11-04 DIAGNOSIS — Z803 Family history of malignant neoplasm of breast: Secondary | ICD-10-CM | POA: Diagnosis not present

## 2016-11-08 ENCOUNTER — Other Ambulatory Visit (HOSPITAL_BASED_OUTPATIENT_CLINIC_OR_DEPARTMENT_OTHER): Payer: Self-pay

## 2016-11-08 DIAGNOSIS — R0683 Snoring: Secondary | ICD-10-CM

## 2016-11-10 ENCOUNTER — Telehealth: Payer: Self-pay | Admitting: Gastroenterology

## 2016-11-10 ENCOUNTER — Encounter: Payer: Medicare Other | Admitting: Gastroenterology

## 2016-11-10 DIAGNOSIS — Z1211 Encounter for screening for malignant neoplasm of colon: Secondary | ICD-10-CM

## 2016-11-10 NOTE — Telephone Encounter (Signed)
This patient's procedure was canceled because she called me the night before due to vomiting Suprep.  Please reschedule her for an afternoon time slot (routine, for screening).  Prep will be Moviprep  Please prescribe ondansetron 4 mg, two tablets.  Take one an hour before the evening prep, another an hour before the morning prep

## 2016-11-11 ENCOUNTER — Other Ambulatory Visit: Payer: Self-pay

## 2016-11-11 MED ORDER — ONDANSETRON HCL 4 MG PO TABS: ORAL_TABLET | ORAL | 0 refills | Status: AC

## 2016-11-11 NOTE — Telephone Encounter (Signed)
Communicated with the pt her new colonoscopy plan. Colon scheduled for 12-20-2016 @ 3pm. Pt aware to pick up sample kit and new instruction.

## 2016-11-15 DIAGNOSIS — Z79891 Long term (current) use of opiate analgesic: Secondary | ICD-10-CM | POA: Diagnosis not present

## 2016-11-15 DIAGNOSIS — G894 Chronic pain syndrome: Secondary | ICD-10-CM | POA: Diagnosis not present

## 2016-11-15 DIAGNOSIS — M961 Postlaminectomy syndrome, not elsewhere classified: Secondary | ICD-10-CM | POA: Diagnosis not present

## 2016-11-15 DIAGNOSIS — M47812 Spondylosis without myelopathy or radiculopathy, cervical region: Secondary | ICD-10-CM | POA: Diagnosis not present

## 2016-12-09 ENCOUNTER — Ambulatory Visit (HOSPITAL_BASED_OUTPATIENT_CLINIC_OR_DEPARTMENT_OTHER): Payer: Medicare Other | Attending: Family Medicine | Admitting: Internal Medicine

## 2016-12-09 VITALS — Ht 64.0 in | Wt 184.0 lb

## 2016-12-09 DIAGNOSIS — G4700 Insomnia, unspecified: Secondary | ICD-10-CM | POA: Insufficient documentation

## 2016-12-09 DIAGNOSIS — R0683 Snoring: Secondary | ICD-10-CM

## 2016-12-12 ENCOUNTER — Encounter: Payer: Self-pay | Admitting: Gastroenterology

## 2016-12-17 DIAGNOSIS — R0683 Snoring: Secondary | ICD-10-CM | POA: Diagnosis not present

## 2016-12-17 NOTE — Procedures (Signed)
   Patient Name: Madison Hines, Madison Hines Date: 12/09/2016 Gender: Female D.O.B: [birth year] Age (years): 66 Referring Provider: Renaye RakersVeita Bland Height (inches): 64 Interpreting Physician: Jetty Duhamellinton Harlean Regula MD, ABSM Weight (lbs): 184 RPSGT: Cherylann ParrDubili, Fred BMI: 32 MRN: [id number] Neck Size: 16.50 CLINICAL INFORMATION Sleep Hines Type: NPSG  Indication for sleep Hines: Excessive Daytime Sleepiness, Fatigue, OSA, Snoring, Witnessed Apneas  Epworth Sleepiness Score: 7  SLEEP Hines TECHNIQUE As per the AASM Manual for the Scoring of Sleep and Associated Events v2.3 (April 2016) with a hypopnea requiring 4% desaturations.  The channels recorded and monitored were frontal, central and occipital EEG, electrooculogram (EOG), submentalis EMG (chin), nasal and oral airflow, thoracic and abdominal wall motion, anterior tibialis EMG, snore microphone, electrocardiogram, and pulse oximetry.  MEDICATIONS Medications self-administered by patient taken the night of the Hines : OXYCODONE HCL  SLEEP ARCHITECTURE The Hines was initiated at 11:36:05 PM and ended at 5:43:29 AM.  Sleep onset time was 0.0 minutes and the sleep efficiency was 43.0%. The total sleep time was 157.9 minutes.  Stage REM latency was 243.1 minutes.  The patient spent 53.29% of the night in stage N1 sleep, 33.73% in stage N2 sleep, 0.00% in stage N3 and 12.98% in REM.  Alpha intrusion was absent.  Supine sleep was 0.00%.  RESPIRATORY PARAMETERS The overall apnea/hypopnea index (AHI) was 1.9 per hour. There were 5 total apneas, including 0 obstructive, 5 central and 0 mixed apneas. There were 0 hypopneas and 1 RERAs.  The AHI during Stage REM sleep was 0.0 per hour.  AHI while supine was N/A per hour.  The mean oxygen saturation was 95.13%. The minimum SpO2 during sleep was 87.00%.  Loud snoring was noted during this Hines.  CARDIAC DATA The 2 lead EKG demonstrated sinus rhythm. The mean heart rate was 66.51 beats per  minute. Other EKG findings include: None.  LEG MOVEMENT DATA The total PLMS were 0 with a resulting PLMS index of 0.00. Associated arousal with leg movement index was 0.0 .  IMPRESSIONS - No significant obstructive sleep apnea occurred during this Hines (AHI = 1.9/h). - No significant central sleep apnea occurred during this Hines (CAI = 1.9/h). - Mild oxygen desaturation was noted during this Hines (Min O2 = 87.00%). - The patient snored with Loud snoring volume. - No cardiac abnormalities were noted during this Hines. - Clinically significant periodic limb movements did not occur during sleep. No significant associated arousals. - Patient had significant difficulty initiating and maintaining sleep, despite oxycodone.  DIAGNOSIS - Primary Snoring (786.09 [R06.83 ICD-10]) - Insomnia (G47.00)  RECOMMENDATIONS - Consider options for managing primary snoring, including ENT evaluation, sleep off flat of back, oral appliance or chin strap, as approrpriate. Consider re-test at a future date if problems remain. - Consider if difficulty initiating and maintaining sleep is an ongoing problem that needs to be addressed. - Sleep hygiene should be reviewed to assess factors that may improve sleep quality. - Weight management and regular exercise should be initiated or continued if appropriate.  [Electronically signed] 12/17/2016 02:24 PM  Jetty Duhamellinton Bryli Mantey MD, ABSM Diplomate, American Board of Sleep Medicine   NPI: [id number]  Waymon BudgeYOUNG,Kayveon Lennartz D Diplomate, American Board of Sleep Medicine  ELECTRONICALLY SIGNED ON:  12/17/2016, 2:18 PM [facility] PH: [phone]   FX: [phone] ACCREDITED BY THE AMERICAN ACADEMY OF SLEEP MEDICINE

## 2016-12-20 ENCOUNTER — Encounter: Payer: Self-pay | Admitting: Gastroenterology

## 2016-12-20 ENCOUNTER — Ambulatory Visit (AMBULATORY_SURGERY_CENTER): Payer: Medicare Other | Admitting: Gastroenterology

## 2016-12-20 VITALS — BP 127/66 | HR 79 | Temp 98.9°F | Resp 16 | Ht 63.0 in | Wt 191.0 lb

## 2016-12-20 DIAGNOSIS — Z1211 Encounter for screening for malignant neoplasm of colon: Secondary | ICD-10-CM | POA: Diagnosis present

## 2016-12-20 DIAGNOSIS — Z1212 Encounter for screening for malignant neoplasm of rectum: Secondary | ICD-10-CM | POA: Diagnosis not present

## 2016-12-20 MED ORDER — SODIUM CHLORIDE 0.9 % IV SOLN: 500.0000 mL | INTRAVENOUS | Status: AC

## 2016-12-20 NOTE — Op Note (Signed)
[facility] Patient Name: Madison Hines Procedure Date: 12/20/2016 3:14 PM MRN: [id number] Endoscopist: Sherilyn Cooter L. Myrtie Neither , MD Age: 66 Referring MD:  Date of Birth: 1950-09-23 Gender: Female Account #: [id number] Procedure:                Colonoscopy Indications:              Screening for colorectal malignant neoplasm (no                            adenomatous polyps on 2007 colonoscopy) Medicines:                Monitored Anesthesia Care Procedure:                Pre-Anesthesia Assessment:                           - Prior to the procedure, a History and Physical                            was performed, and patient medications and                            allergies were reviewed. The patient's tolerance of                            previous anesthesia was also reviewed. The risks                            and benefits of the procedure and the sedation                            options and risks were discussed with the patient.                            All questions were answered, and informed consent                            was obtained. Prior Anticoagulants: The patient has                            taken no previous anticoagulant or antiplatelet                            agents. ASA Grade Assessment: II - A patient with                            mild systemic disease. After reviewing the risks                            and benefits, the patient was deemed in                            satisfactory condition to undergo the procedure.  After obtaining informed consent, the colonoscope                            was passed under direct vision. Throughout the                            procedure, the patient's blood pressure, pulse, and                            oxygen saturations were monitored continuously. The                            Colonoscope was introduced through the anus and                            advanced to the the  cecum, identified by                            appendiceal orifice and ileocecal valve. The                            colonoscopy was extremely difficult due to                            significant looping. Successful completion of the                            procedure was aided by changing the patient to a                            supine position and using manual pressure. The                            patient tolerated the procedure well. The quality                            of the bowel preparation was good. The ileocecal                            valve, appendiceal orifice, and rectum were                            photographed. The quality of the bowel preparation                            was evaluated using the BBPS [facility] Bowel                            Preparation Scale) with scores of: Right Colon = 2,                            Transverse Colon = 2 and Left Colon = 2. The total  BBPS score equals 6. After lavage. The bowel                            preparation used was SUPREP. Scope In: 3:22:30 PM Scope Out: 3:49:40 PM Scope Withdrawal Time: 0 hours 11 minutes 42 seconds  Total Procedure Duration: 0 hours 27 minutes 10 seconds  Findings:                 The digital rectal exam findings include decreased                            sphincter tone.                           The entire examined colon appeared normal on direct                            and retroflexion views. Complications:            No immediate complications. Estimated Blood Loss:     Estimated blood loss: none. Impression:               - Decreased sphincter tone found on digital rectal                            exam.                           - The entire examined colon is normal on direct and                            retroflexion views.                           - No specimens collected. Recommendation:           - Patient has a contact number available for                             emergencies. The signs and symptoms of potential                            delayed complications were discussed with the                            patient. Return to normal activities tomorrow.                            Written discharge instructions were provided to the                            patient.                           - Resume previous diet.                           - Continue present medications.                           -  No repeat routine colonoscopy due to age. Catheleen Langhorne L. Myrtie Neither, MD 12/20/2016 3:53:22 PM This report has been signed electronically.

## 2016-12-20 NOTE — Progress Notes (Signed)
Alert and oriented x3, pleased with MAC, report to RN

## 2016-12-20 NOTE — Patient Instructions (Signed)
YOU HAD AN ENDOSCOPIC PROCEDURE TODAY AT THE [facility]:   Refer to the procedure report that was given to you for any specific questions about what was found during the examination.  If the procedure report does not answer your questions, please call your gastroenterologist to clarify.  If you requested that your care partner not be given the details of your procedure findings, then the procedure report has been included in a sealed envelope for you to review at your convenience later.  YOU SHOULD EXPECT: Some feelings of bloating in the abdomen. Passage of more gas than usual.  Walking can help get rid of the air that was put into your GI tract during the procedure and reduce the bloating. If you had a lower endoscopy (such as a colonoscopy or flexible sigmoidoscopy) you may notice spotting of blood in your stool or on the toilet paper. If you underwent a bowel prep for your procedure, you may not have a normal bowel movement for a few days.  Please Note:  You might notice some irritation and congestion in your nose or some drainage.  This is from the oxygen used during your procedure.  There is no need for concern and it should clear up in a day or so.  SYMPTOMS TO REPORT IMMEDIATELY:   Following lower endoscopy (colonoscopy or flexible sigmoidoscopy):  Excessive amounts of blood in the stool  Significant tenderness or worsening of abdominal pains  Swelling of the abdomen that is new, acute  Fever of 100F or higher  For urgent or emergent issues, a gastroenterologist can be reached at any hour by calling [phone].   DIET:  We do recommend a small meal at first, but then you may proceed to your regular diet.  Drink plenty of fluids but you should avoid alcoholic beverages for 24 hours.  ACTIVITY:  You should plan to take it easy for the rest of today and you should NOT DRIVE or use heavy machinery until tomorrow (because of the sedation medicines used during the test).     FOLLOW UP: Our staff will call the number listed on your records the next business day following your procedure to check on you and address any questions or concerns that you may have regarding the information given to you following your procedure. If we do not reach you, we will leave a message.  However, if you are feeling well and you are not experiencing any problems, there is no need to return our call.  We will assume that you have returned to your regular daily activities without incident.  If any biopsies were taken you will be contacted by phone or by letter within the next 1-3 weeks.  Please call us at [phone] if you have not heard about the biopsies in 3 weeks.    SIGNATURES/CONFIDENTIALITY: You and/or your care partner have signed paperwork which will be entered into your electronic medical record.  These signatures attest to the fact that that the information above on your After Visit Summary has been reviewed and is understood.  Full responsibility of the confidentiality of this discharge information lies with you and/or your care-partner. 

## 2016-12-21 ENCOUNTER — Telehealth: Payer: Self-pay

## 2016-12-21 ENCOUNTER — Telehealth: Payer: Self-pay | Admitting: *Deleted

## 2016-12-21 NOTE — Telephone Encounter (Signed)
  Follow up Call-  Call back number 12/20/2016  Post procedure Call Back phone  # [phone]  Permission to leave phone message Yes  Some recent data might be hidden     No answer and no voicemail

## 2016-12-21 NOTE — Telephone Encounter (Signed)
  Follow up Call-  Call back number 12/20/2016  Post procedure Call Back phone  # [phone]  Permission to leave phone message Yes  Some recent data might be hidden    [facility]

## 2017-01-05 DIAGNOSIS — I10 Essential (primary) hypertension: Secondary | ICD-10-CM | POA: Diagnosis not present

## 2017-01-05 DIAGNOSIS — J449 Chronic obstructive pulmonary disease, unspecified: Secondary | ICD-10-CM | POA: Diagnosis not present

## 2017-01-05 DIAGNOSIS — G894 Chronic pain syndrome: Secondary | ICD-10-CM | POA: Diagnosis not present

## 2017-01-10 DIAGNOSIS — Z79891 Long term (current) use of opiate analgesic: Secondary | ICD-10-CM | POA: Diagnosis not present

## 2017-01-10 DIAGNOSIS — M47812 Spondylosis without myelopathy or radiculopathy, cervical region: Secondary | ICD-10-CM | POA: Diagnosis not present

## 2017-01-10 DIAGNOSIS — M961 Postlaminectomy syndrome, not elsewhere classified: Secondary | ICD-10-CM | POA: Diagnosis not present

## 2017-01-10 DIAGNOSIS — G894 Chronic pain syndrome: Secondary | ICD-10-CM | POA: Diagnosis not present

## 2017-03-07 DIAGNOSIS — M47812 Spondylosis without myelopathy or radiculopathy, cervical region: Secondary | ICD-10-CM | POA: Diagnosis not present

## 2017-03-07 DIAGNOSIS — G894 Chronic pain syndrome: Secondary | ICD-10-CM | POA: Diagnosis not present

## 2017-03-07 DIAGNOSIS — Z79891 Long term (current) use of opiate analgesic: Secondary | ICD-10-CM | POA: Diagnosis not present

## 2017-03-07 DIAGNOSIS — M961 Postlaminectomy syndrome, not elsewhere classified: Secondary | ICD-10-CM | POA: Diagnosis not present

## 2017-03-23 DIAGNOSIS — Z0000 Encounter for general adult medical examination without abnormal findings: Secondary | ICD-10-CM | POA: Diagnosis not present

## 2017-04-04 DIAGNOSIS — I10 Essential (primary) hypertension: Secondary | ICD-10-CM | POA: Diagnosis not present

## 2017-04-04 DIAGNOSIS — Z0000 Encounter for general adult medical examination without abnormal findings: Secondary | ICD-10-CM | POA: Diagnosis not present

## 2017-04-04 DIAGNOSIS — J449 Chronic obstructive pulmonary disease, unspecified: Secondary | ICD-10-CM | POA: Diagnosis not present

## 2017-04-04 DIAGNOSIS — D649 Anemia, unspecified: Secondary | ICD-10-CM | POA: Diagnosis not present

## 2017-05-09 DIAGNOSIS — M47812 Spondylosis without myelopathy or radiculopathy, cervical region: Secondary | ICD-10-CM | POA: Diagnosis not present

## 2017-05-09 DIAGNOSIS — G894 Chronic pain syndrome: Secondary | ICD-10-CM | POA: Diagnosis not present

## 2017-05-09 DIAGNOSIS — Z79891 Long term (current) use of opiate analgesic: Secondary | ICD-10-CM | POA: Diagnosis not present

## 2017-05-09 DIAGNOSIS — M961 Postlaminectomy syndrome, not elsewhere classified: Secondary | ICD-10-CM | POA: Diagnosis not present

## 2017-07-10 DIAGNOSIS — M47812 Spondylosis without myelopathy or radiculopathy, cervical region: Secondary | ICD-10-CM | POA: Diagnosis not present

## 2017-07-10 DIAGNOSIS — G894 Chronic pain syndrome: Secondary | ICD-10-CM | POA: Diagnosis not present

## 2017-07-10 DIAGNOSIS — Z79891 Long term (current) use of opiate analgesic: Secondary | ICD-10-CM | POA: Diagnosis not present

## 2017-07-10 DIAGNOSIS — M961 Postlaminectomy syndrome, not elsewhere classified: Secondary | ICD-10-CM | POA: Diagnosis not present

## 2017-07-20 DIAGNOSIS — D529 Folate deficiency anemia, unspecified: Secondary | ICD-10-CM | POA: Diagnosis not present

## 2017-07-20 DIAGNOSIS — E611 Iron deficiency: Secondary | ICD-10-CM | POA: Diagnosis not present

## 2017-07-20 DIAGNOSIS — D6489 Other specified anemias: Secondary | ICD-10-CM | POA: Diagnosis not present

## 2017-07-20 DIAGNOSIS — D509 Iron deficiency anemia, unspecified: Secondary | ICD-10-CM | POA: Diagnosis not present

## 2017-07-20 DIAGNOSIS — I10 Essential (primary) hypertension: Secondary | ICD-10-CM | POA: Diagnosis not present

## 2017-07-20 DIAGNOSIS — D649 Anemia, unspecified: Secondary | ICD-10-CM | POA: Diagnosis not present

## 2017-09-07 DIAGNOSIS — M961 Postlaminectomy syndrome, not elsewhere classified: Secondary | ICD-10-CM | POA: Diagnosis not present

## 2017-09-07 DIAGNOSIS — M47812 Spondylosis without myelopathy or radiculopathy, cervical region: Secondary | ICD-10-CM | POA: Diagnosis not present

## 2017-09-07 DIAGNOSIS — Z79891 Long term (current) use of opiate analgesic: Secondary | ICD-10-CM | POA: Diagnosis not present

## 2017-09-07 DIAGNOSIS — G894 Chronic pain syndrome: Secondary | ICD-10-CM | POA: Diagnosis not present

## 2017-11-07 DIAGNOSIS — M961 Postlaminectomy syndrome, not elsewhere classified: Secondary | ICD-10-CM | POA: Diagnosis not present

## 2017-11-07 DIAGNOSIS — G894 Chronic pain syndrome: Secondary | ICD-10-CM | POA: Diagnosis not present

## 2017-11-07 DIAGNOSIS — M47812 Spondylosis without myelopathy or radiculopathy, cervical region: Secondary | ICD-10-CM | POA: Diagnosis not present

## 2017-11-07 DIAGNOSIS — Z79891 Long term (current) use of opiate analgesic: Secondary | ICD-10-CM | POA: Diagnosis not present

## 2017-11-20 DIAGNOSIS — J0101 Acute recurrent maxillary sinusitis: Secondary | ICD-10-CM | POA: Diagnosis not present

## 2017-11-20 DIAGNOSIS — J9801 Acute bronchospasm: Secondary | ICD-10-CM | POA: Diagnosis not present

## 2018-02-01 DIAGNOSIS — Z79891 Long term (current) use of opiate analgesic: Secondary | ICD-10-CM | POA: Diagnosis not present

## 2018-02-01 DIAGNOSIS — M47812 Spondylosis without myelopathy or radiculopathy, cervical region: Secondary | ICD-10-CM | POA: Diagnosis not present

## 2018-02-01 DIAGNOSIS — M961 Postlaminectomy syndrome, not elsewhere classified: Secondary | ICD-10-CM | POA: Diagnosis not present

## 2018-02-01 DIAGNOSIS — G894 Chronic pain syndrome: Secondary | ICD-10-CM | POA: Diagnosis not present

## 2018-03-27 ENCOUNTER — Other Ambulatory Visit: Payer: Self-pay

## 2018-03-27 NOTE — Patient Outreach (Signed)
[facility] Customer service manager [facility]) Care Management  03/27/2018  TEKELIA KAREEM [birth year] [id number]   Medication Adherence call to Mrs. Kamarii Buren left a message for patient to call back patient is due on Benazepril 10 mg. Mrs. Eckman is showing past due under [facility].   Lillia Abed CPhT Pharmacy Technician [facility] Care Management Direct Dial [phone]  Fax [phone] [email or web address]

## 2018-03-29 DIAGNOSIS — Z79891 Long term (current) use of opiate analgesic: Secondary | ICD-10-CM | POA: Diagnosis not present

## 2018-03-29 DIAGNOSIS — M47812 Spondylosis without myelopathy or radiculopathy, cervical region: Secondary | ICD-10-CM | POA: Diagnosis not present

## 2018-03-29 DIAGNOSIS — M961 Postlaminectomy syndrome, not elsewhere classified: Secondary | ICD-10-CM | POA: Diagnosis not present

## 2018-03-29 DIAGNOSIS — G894 Chronic pain syndrome: Secondary | ICD-10-CM | POA: Diagnosis not present

## 2018-04-12 DIAGNOSIS — I10 Essential (primary) hypertension: Secondary | ICD-10-CM | POA: Diagnosis not present

## 2018-04-12 DIAGNOSIS — J440 Chronic obstructive pulmonary disease with acute lower respiratory infection: Secondary | ICD-10-CM | POA: Diagnosis not present

## 2018-04-12 DIAGNOSIS — R799 Abnormal finding of blood chemistry, unspecified: Secondary | ICD-10-CM | POA: Diagnosis not present

## 2018-04-12 DIAGNOSIS — R51 Headache: Secondary | ICD-10-CM | POA: Diagnosis not present

## 2018-04-12 DIAGNOSIS — D6489 Other specified anemias: Secondary | ICD-10-CM | POA: Diagnosis not present

## 2018-05-24 DIAGNOSIS — M961 Postlaminectomy syndrome, not elsewhere classified: Secondary | ICD-10-CM | POA: Diagnosis not present

## 2018-05-24 DIAGNOSIS — M47812 Spondylosis without myelopathy or radiculopathy, cervical region: Secondary | ICD-10-CM | POA: Diagnosis not present

## 2018-05-24 DIAGNOSIS — Z79891 Long term (current) use of opiate analgesic: Secondary | ICD-10-CM | POA: Diagnosis not present

## 2018-05-24 DIAGNOSIS — G894 Chronic pain syndrome: Secondary | ICD-10-CM | POA: Diagnosis not present

## 2018-07-12 DIAGNOSIS — I10 Essential (primary) hypertension: Secondary | ICD-10-CM | POA: Diagnosis not present

## 2018-07-24 DIAGNOSIS — M47812 Spondylosis without myelopathy or radiculopathy, cervical region: Secondary | ICD-10-CM | POA: Diagnosis not present

## 2018-07-24 DIAGNOSIS — G894 Chronic pain syndrome: Secondary | ICD-10-CM | POA: Diagnosis not present

## 2018-07-24 DIAGNOSIS — Z79891 Long term (current) use of opiate analgesic: Secondary | ICD-10-CM | POA: Diagnosis not present

## 2018-07-24 DIAGNOSIS — M961 Postlaminectomy syndrome, not elsewhere classified: Secondary | ICD-10-CM | POA: Diagnosis not present

## 2018-09-18 DIAGNOSIS — M47812 Spondylosis without myelopathy or radiculopathy, cervical region: Secondary | ICD-10-CM | POA: Diagnosis not present

## 2018-09-18 DIAGNOSIS — M961 Postlaminectomy syndrome, not elsewhere classified: Secondary | ICD-10-CM | POA: Diagnosis not present

## 2018-09-18 DIAGNOSIS — G894 Chronic pain syndrome: Secondary | ICD-10-CM | POA: Diagnosis not present

## 2018-09-18 DIAGNOSIS — Z79891 Long term (current) use of opiate analgesic: Secondary | ICD-10-CM | POA: Diagnosis not present

## 2018-11-13 DIAGNOSIS — Z79891 Long term (current) use of opiate analgesic: Secondary | ICD-10-CM | POA: Diagnosis not present

## 2018-11-13 DIAGNOSIS — G894 Chronic pain syndrome: Secondary | ICD-10-CM | POA: Diagnosis not present

## 2018-11-13 DIAGNOSIS — M961 Postlaminectomy syndrome, not elsewhere classified: Secondary | ICD-10-CM | POA: Diagnosis not present

## 2018-11-13 DIAGNOSIS — M47812 Spondylosis without myelopathy or radiculopathy, cervical region: Secondary | ICD-10-CM | POA: Diagnosis not present

## 2018-11-20 DIAGNOSIS — K219 Gastro-esophageal reflux disease without esophagitis: Secondary | ICD-10-CM | POA: Diagnosis not present

## 2018-11-20 DIAGNOSIS — I10 Essential (primary) hypertension: Secondary | ICD-10-CM | POA: Diagnosis not present

## 2018-12-04 DIAGNOSIS — K210 Gastro-esophageal reflux disease with esophagitis: Secondary | ICD-10-CM | POA: Diagnosis not present

## 2018-12-04 DIAGNOSIS — I10 Essential (primary) hypertension: Secondary | ICD-10-CM | POA: Diagnosis not present

## 2018-12-21 DIAGNOSIS — I10 Essential (primary) hypertension: Secondary | ICD-10-CM | POA: Diagnosis not present

## 2019-01-08 DIAGNOSIS — M47812 Spondylosis without myelopathy or radiculopathy, cervical region: Secondary | ICD-10-CM | POA: Diagnosis not present

## 2019-01-08 DIAGNOSIS — M961 Postlaminectomy syndrome, not elsewhere classified: Secondary | ICD-10-CM | POA: Diagnosis not present

## 2019-01-08 DIAGNOSIS — Z79891 Long term (current) use of opiate analgesic: Secondary | ICD-10-CM | POA: Diagnosis not present

## 2019-01-08 DIAGNOSIS — G894 Chronic pain syndrome: Secondary | ICD-10-CM | POA: Diagnosis not present

## 2019-02-16 DIAGNOSIS — R1011 Right upper quadrant pain: Secondary | ICD-10-CM | POA: Diagnosis not present

## 2019-02-16 DIAGNOSIS — I10 Essential (primary) hypertension: Secondary | ICD-10-CM | POA: Diagnosis not present

## 2019-02-18 ENCOUNTER — Other Ambulatory Visit: Payer: Self-pay | Admitting: Family Medicine

## 2019-02-18 DIAGNOSIS — R1010 Upper abdominal pain, unspecified: Secondary | ICD-10-CM

## 2019-02-25 ENCOUNTER — Ambulatory Visit
Admission: RE | Admit: 2019-02-25 | Discharge: 2019-02-25 | Disposition: A | Discharge status: Home / Self Care | Payer: Medicare Other | Source: Ambulatory Visit | Attending: Family Medicine | Admitting: Family Medicine

## 2019-02-25 DIAGNOSIS — R1010 Upper abdominal pain, unspecified: Secondary | ICD-10-CM

## 2019-02-25 DIAGNOSIS — N200 Calculus of kidney: Secondary | ICD-10-CM | POA: Diagnosis not present

## 2019-02-25 DIAGNOSIS — Z96643 Presence of artificial hip joint, bilateral: Secondary | ICD-10-CM | POA: Diagnosis not present

## 2019-02-25 DIAGNOSIS — K6389 Other specified diseases of intestine: Secondary | ICD-10-CM | POA: Diagnosis not present

## 2019-02-25 MED ORDER — IOPAMIDOL (ISOVUE-300) INJECTION 61%
125.0000 mL | Freq: Once | INTRAVENOUS | Status: AC | PRN
  Administered 2019-02-25: 125 mL via INTRAVENOUS

## 2019-03-05 DIAGNOSIS — M961 Postlaminectomy syndrome, not elsewhere classified: Secondary | ICD-10-CM | POA: Diagnosis not present

## 2019-03-05 DIAGNOSIS — Z79891 Long term (current) use of opiate analgesic: Secondary | ICD-10-CM | POA: Diagnosis not present

## 2019-03-05 DIAGNOSIS — M47812 Spondylosis without myelopathy or radiculopathy, cervical region: Secondary | ICD-10-CM | POA: Diagnosis not present

## 2019-03-05 DIAGNOSIS — G894 Chronic pain syndrome: Secondary | ICD-10-CM | POA: Diagnosis not present

## 2019-03-07 DIAGNOSIS — K5903 Drug induced constipation: Secondary | ICD-10-CM | POA: Diagnosis not present

## 2019-03-28 ENCOUNTER — Other Ambulatory Visit: Payer: Self-pay | Admitting: Family Medicine

## 2019-03-28 DIAGNOSIS — Z1231 Encounter for screening mammogram for malignant neoplasm of breast: Secondary | ICD-10-CM

## 2019-04-11 DIAGNOSIS — K5903 Drug induced constipation: Secondary | ICD-10-CM | POA: Diagnosis not present

## 2019-04-11 DIAGNOSIS — I10 Essential (primary) hypertension: Secondary | ICD-10-CM | POA: Diagnosis not present

## 2019-04-29 DIAGNOSIS — G894 Chronic pain syndrome: Secondary | ICD-10-CM | POA: Diagnosis not present

## 2019-04-29 DIAGNOSIS — Z79891 Long term (current) use of opiate analgesic: Secondary | ICD-10-CM | POA: Diagnosis not present

## 2019-04-30 DIAGNOSIS — G894 Chronic pain syndrome: Secondary | ICD-10-CM | POA: Diagnosis not present

## 2019-04-30 DIAGNOSIS — Z79891 Long term (current) use of opiate analgesic: Secondary | ICD-10-CM | POA: Diagnosis not present

## 2019-04-30 DIAGNOSIS — M961 Postlaminectomy syndrome, not elsewhere classified: Secondary | ICD-10-CM | POA: Diagnosis not present

## 2019-04-30 DIAGNOSIS — M47812 Spondylosis without myelopathy or radiculopathy, cervical region: Secondary | ICD-10-CM | POA: Diagnosis not present

## 2019-05-01 ENCOUNTER — Ambulatory Visit
Admission: RE | Admit: 2019-05-01 | Discharge: 2019-05-01 | Disposition: A | Discharge status: Home / Self Care | Payer: Medicare Other | Source: Ambulatory Visit | Attending: Anesthesiology | Admitting: Anesthesiology

## 2019-05-01 ENCOUNTER — Other Ambulatory Visit: Payer: Self-pay | Admitting: Anesthesiology

## 2019-05-01 DIAGNOSIS — M545 Low back pain, unspecified: Secondary | ICD-10-CM

## 2019-05-08 DIAGNOSIS — R079 Chest pain, unspecified: Secondary | ICD-10-CM | POA: Diagnosis not present

## 2019-05-08 DIAGNOSIS — I509 Heart failure, unspecified: Secondary | ICD-10-CM | POA: Diagnosis not present

## 2019-05-08 DIAGNOSIS — J449 Chronic obstructive pulmonary disease, unspecified: Secondary | ICD-10-CM | POA: Diagnosis not present

## 2019-05-08 DIAGNOSIS — M940 Chondrocostal junction syndrome [Tietze]: Secondary | ICD-10-CM | POA: Diagnosis not present

## 2019-05-08 DIAGNOSIS — I10 Essential (primary) hypertension: Secondary | ICD-10-CM | POA: Diagnosis not present

## 2019-05-15 DIAGNOSIS — Z0000 Encounter for general adult medical examination without abnormal findings: Secondary | ICD-10-CM | POA: Diagnosis not present

## 2019-05-15 DIAGNOSIS — J440 Chronic obstructive pulmonary disease with acute lower respiratory infection: Secondary | ICD-10-CM | POA: Diagnosis not present

## 2019-05-15 DIAGNOSIS — I10 Essential (primary) hypertension: Secondary | ICD-10-CM | POA: Diagnosis not present

## 2019-05-15 DIAGNOSIS — M940 Chondrocostal junction syndrome [Tietze]: Secondary | ICD-10-CM | POA: Diagnosis not present

## 2019-05-17 ENCOUNTER — Other Ambulatory Visit: Payer: Self-pay

## 2019-05-17 ENCOUNTER — Ambulatory Visit
Admission: RE | Admit: 2019-05-17 | Discharge: 2019-05-17 | Disposition: A | Discharge status: Home / Self Care | Payer: Medicare Other | Source: Ambulatory Visit | Attending: Family Medicine | Admitting: Family Medicine

## 2019-05-17 DIAGNOSIS — Z1231 Encounter for screening mammogram for malignant neoplasm of breast: Secondary | ICD-10-CM

## 2019-05-22 ENCOUNTER — Other Ambulatory Visit: Payer: Self-pay | Admitting: Family Medicine

## 2019-05-22 DIAGNOSIS — R928 Other abnormal and inconclusive findings on diagnostic imaging of breast: Secondary | ICD-10-CM

## 2019-05-29 ENCOUNTER — Other Ambulatory Visit: Payer: Self-pay

## 2019-05-29 ENCOUNTER — Ambulatory Visit: Payer: Medicare Other

## 2019-05-29 ENCOUNTER — Ambulatory Visit
Admission: RE | Admit: 2019-05-29 | Discharge: 2019-05-29 | Disposition: A | Discharge status: Home / Self Care | Payer: Medicare Other | Source: Ambulatory Visit | Attending: Family Medicine | Admitting: Family Medicine

## 2019-05-29 DIAGNOSIS — M47812 Spondylosis without myelopathy or radiculopathy, cervical region: Secondary | ICD-10-CM | POA: Diagnosis not present

## 2019-05-29 DIAGNOSIS — G894 Chronic pain syndrome: Secondary | ICD-10-CM | POA: Diagnosis not present

## 2019-05-29 DIAGNOSIS — Z79891 Long term (current) use of opiate analgesic: Secondary | ICD-10-CM | POA: Diagnosis not present

## 2019-05-29 DIAGNOSIS — R928 Other abnormal and inconclusive findings on diagnostic imaging of breast: Secondary | ICD-10-CM | POA: Diagnosis not present

## 2019-05-29 DIAGNOSIS — M961 Postlaminectomy syndrome, not elsewhere classified: Secondary | ICD-10-CM | POA: Diagnosis not present

## 2019-07-29 DIAGNOSIS — M961 Postlaminectomy syndrome, not elsewhere classified: Secondary | ICD-10-CM | POA: Diagnosis not present

## 2019-07-29 DIAGNOSIS — M47812 Spondylosis without myelopathy or radiculopathy, cervical region: Secondary | ICD-10-CM | POA: Diagnosis not present

## 2019-07-29 DIAGNOSIS — G894 Chronic pain syndrome: Secondary | ICD-10-CM | POA: Diagnosis not present

## 2019-07-29 DIAGNOSIS — Z79891 Long term (current) use of opiate analgesic: Secondary | ICD-10-CM | POA: Diagnosis not present

## 2019-08-03 ENCOUNTER — Ambulatory Visit: Payer: Medicare Other

## 2019-09-23 DIAGNOSIS — G894 Chronic pain syndrome: Secondary | ICD-10-CM | POA: Diagnosis not present

## 2019-09-23 DIAGNOSIS — M47812 Spondylosis without myelopathy or radiculopathy, cervical region: Secondary | ICD-10-CM | POA: Diagnosis not present

## 2019-09-23 DIAGNOSIS — Z79891 Long term (current) use of opiate analgesic: Secondary | ICD-10-CM | POA: Diagnosis not present

## 2019-09-23 DIAGNOSIS — M961 Postlaminectomy syndrome, not elsewhere classified: Secondary | ICD-10-CM | POA: Diagnosis not present

## 2019-11-18 DIAGNOSIS — M961 Postlaminectomy syndrome, not elsewhere classified: Secondary | ICD-10-CM | POA: Diagnosis not present

## 2019-11-18 DIAGNOSIS — Z79891 Long term (current) use of opiate analgesic: Secondary | ICD-10-CM | POA: Diagnosis not present

## 2019-11-18 DIAGNOSIS — M47812 Spondylosis without myelopathy or radiculopathy, cervical region: Secondary | ICD-10-CM | POA: Diagnosis not present

## 2019-11-18 DIAGNOSIS — G894 Chronic pain syndrome: Secondary | ICD-10-CM | POA: Diagnosis not present

## 2020-01-13 DIAGNOSIS — G894 Chronic pain syndrome: Secondary | ICD-10-CM | POA: Diagnosis not present

## 2020-01-13 DIAGNOSIS — M47812 Spondylosis without myelopathy or radiculopathy, cervical region: Secondary | ICD-10-CM | POA: Diagnosis not present

## 2020-01-13 DIAGNOSIS — Z79891 Long term (current) use of opiate analgesic: Secondary | ICD-10-CM | POA: Diagnosis not present

## 2020-01-13 DIAGNOSIS — M961 Postlaminectomy syndrome, not elsewhere classified: Secondary | ICD-10-CM | POA: Diagnosis not present

## 2020-04-20 ENCOUNTER — Other Ambulatory Visit: Payer: Self-pay | Admitting: Internal Medicine

## 2020-04-20 DIAGNOSIS — Z1231 Encounter for screening mammogram for malignant neoplasm of breast: Secondary | ICD-10-CM

## 2021-08-03 IMAGING — CR DG LUMBAR SPINE COMPLETE 4+V
5 series · 5 of 5 positions shown · non-contrast
Comparison: November 05, 2015.

CLINICAL DATA: Low back and bilateral lower extremity pain.

EXAM:
LUMBAR SPINE - COMPLETE 4+ VIEW

[t lumbar spine ap]
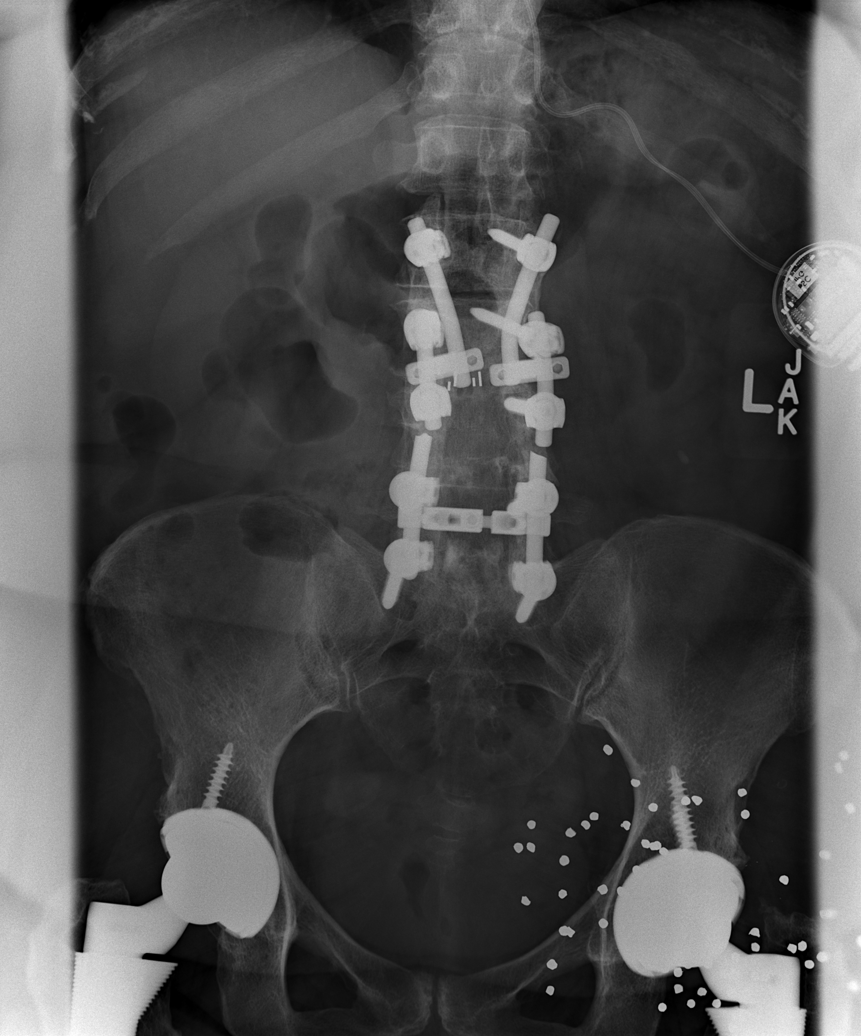

[t lumbar spine obl (1 of 2)]
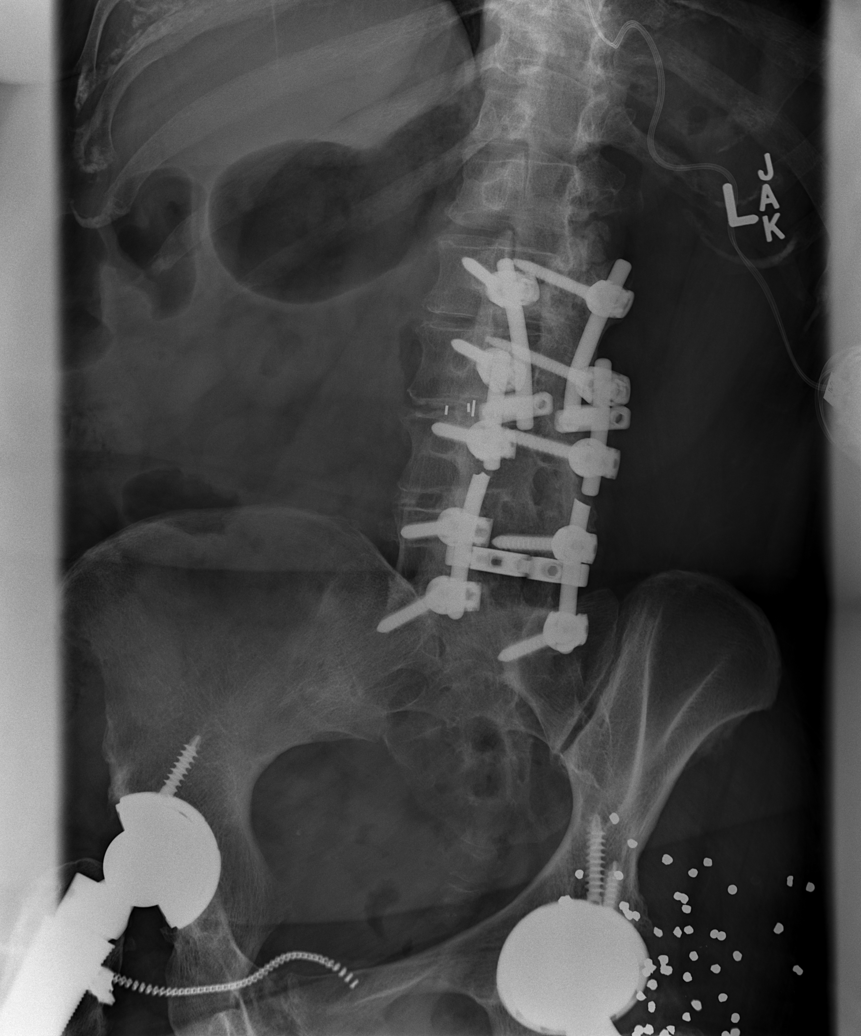

[t lumbar spine obl (2 of 2)]
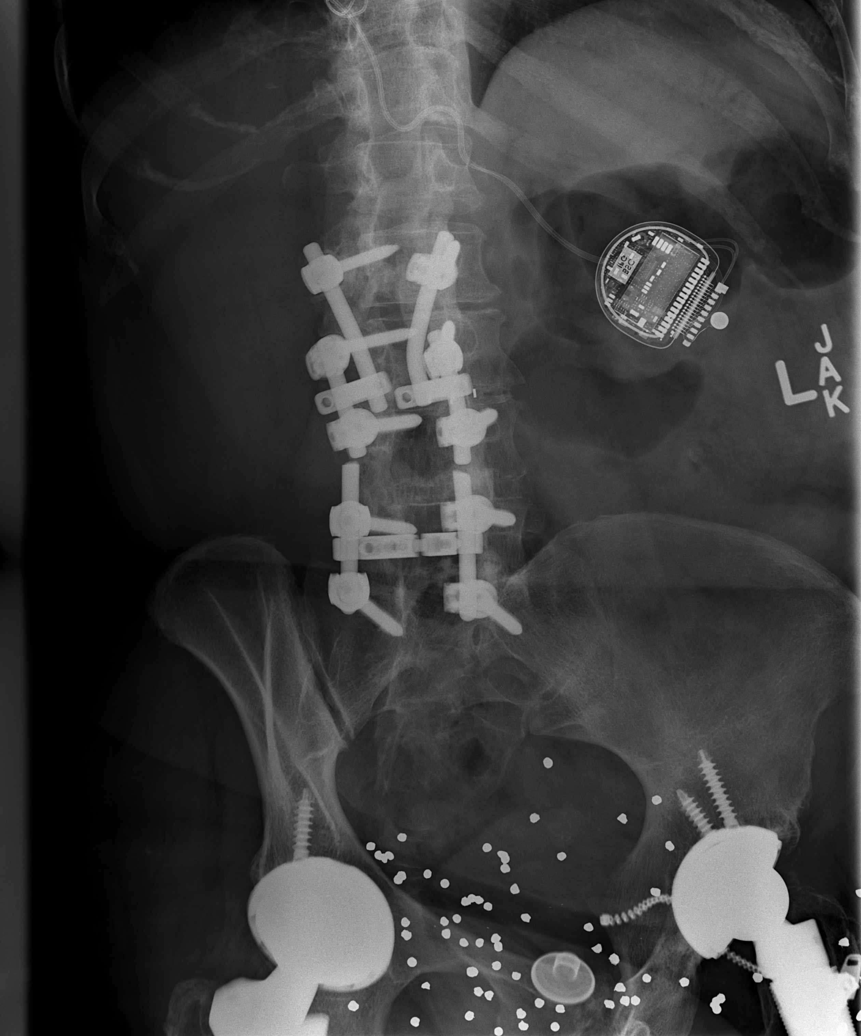

[t lumbar spine lat]
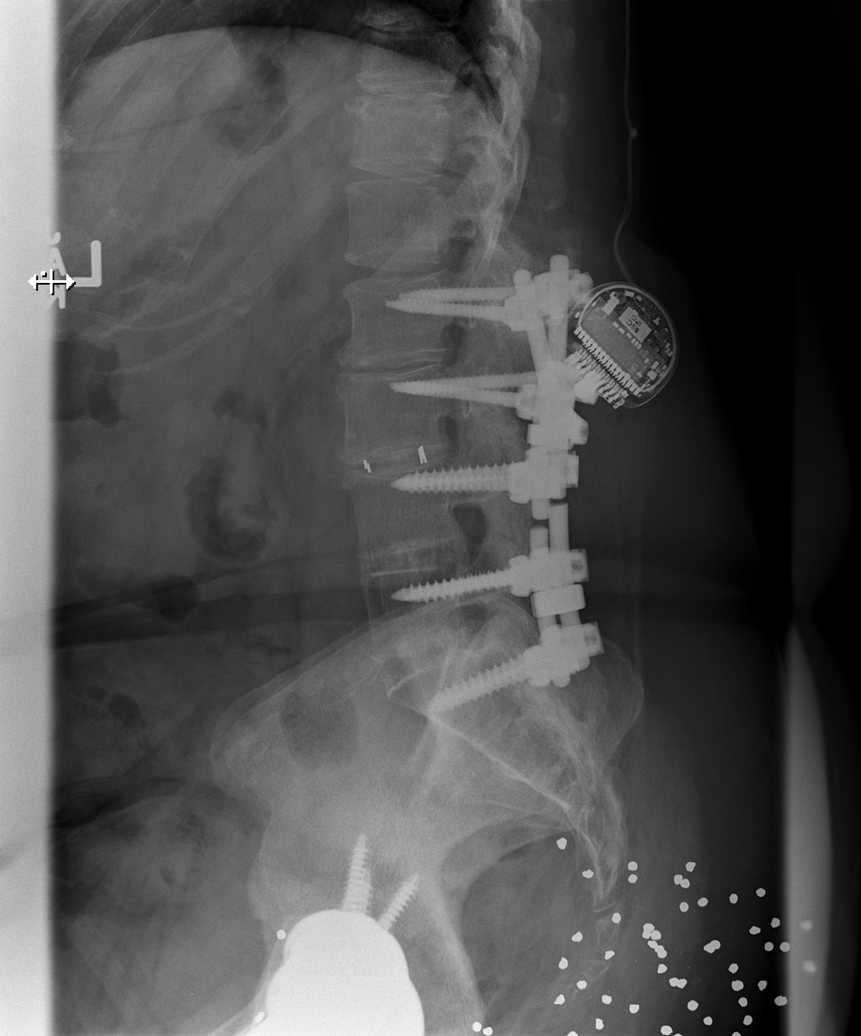

[t lumbar l-5 s-1 spot]
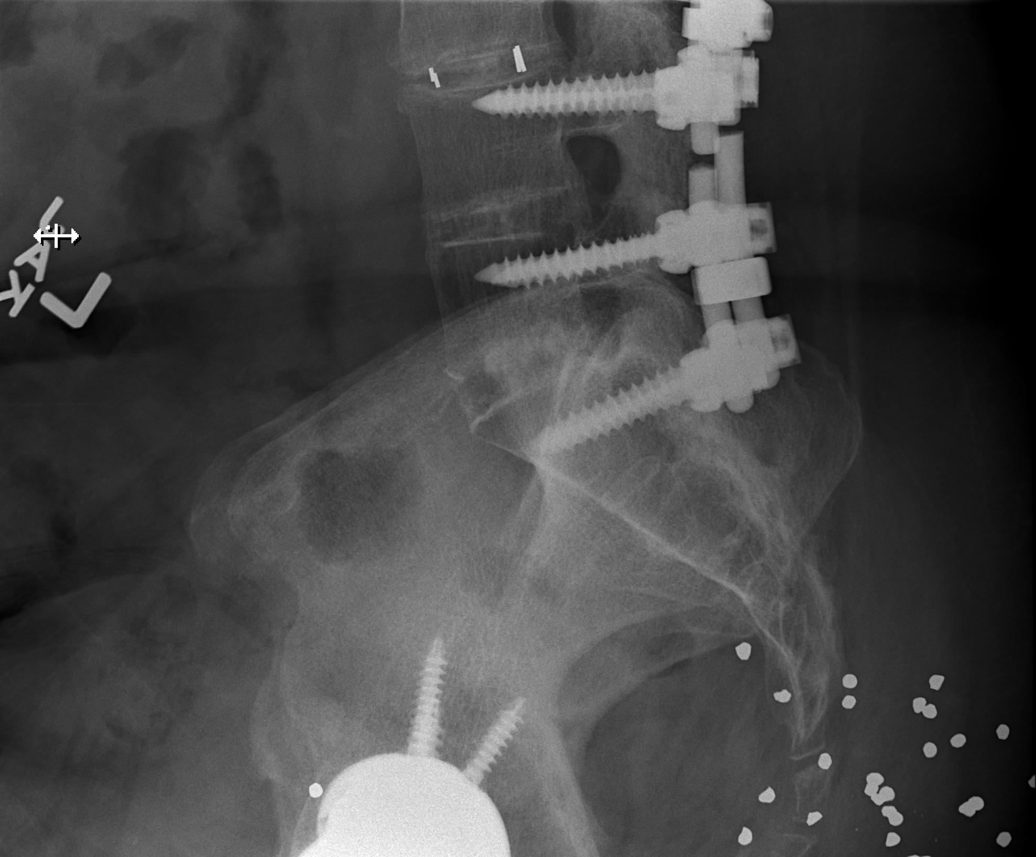

[5 of 5 positions shown; findings below may reference images not displayed]

FINDINGS: Status post surgical posterior fusion of L2-3, L3-4, L4-5 and L5-S1.
Interbody fusion of L3-4 is noted. Good alignment of vertebral
bodies is noted. No fracture or spondylolisthesis is noted.
IMPRESSION: Postsurgical changes as described above. No acute abnormality seen
in the lumbar spine.

## 2021-12-06 ENCOUNTER — Other Ambulatory Visit (HOSPITAL_COMMUNITY): Payer: Self-pay

## 2021-12-06 MED ORDER — OXYCODONE-ACETAMINOPHEN 10-325 MG PO TABS
ORAL_TABLET | ORAL | 0 refills | Status: AC
  Filled 2021-12-06: qty 150, 30d supply, fill #0

## 2022-02-04 DEATH — deceased
